# Patient Record
Sex: Female | Born: 1950 | Race: White | Hispanic: No | Marital: Married | Smoking: Never smoker
Health system: Southern US, Community
[De-identification: ages and names within clinical notes are randomized; demographics above are authoritative.]

## PROBLEM LIST (undated history)

## (undated) DIAGNOSIS — T7840XA Allergy, unspecified, initial encounter: Secondary | ICD-10-CM

## (undated) DIAGNOSIS — E079 Disorder of thyroid, unspecified: Secondary | ICD-10-CM

## (undated) DIAGNOSIS — R0683 Snoring: Secondary | ICD-10-CM

## (undated) DIAGNOSIS — F419 Anxiety disorder, unspecified: Secondary | ICD-10-CM

## (undated) DIAGNOSIS — H269 Unspecified cataract: Secondary | ICD-10-CM

## (undated) DIAGNOSIS — E785 Hyperlipidemia, unspecified: Secondary | ICD-10-CM

## (undated) DIAGNOSIS — I10 Essential (primary) hypertension: Secondary | ICD-10-CM

## (undated) HISTORY — PX: COLONOSCOPY: SHX174

## (undated) HISTORY — DX: Essential (primary) hypertension: I10

## (undated) HISTORY — PX: ABDOMINAL HYSTERECTOMY: SHX81

## (undated) HISTORY — PX: TUMOR EXCISION: SHX421

## (undated) HISTORY — PX: DILATION AND CURETTAGE OF UTERUS: SHX78

## (undated) HISTORY — DX: Disorder of thyroid, unspecified: E07.9

## (undated) HISTORY — DX: Snoring: R06.83

## (undated) HISTORY — DX: Anxiety disorder, unspecified: F41.9

## (undated) HISTORY — DX: Allergy, unspecified, initial encounter: T78.40XA

## (undated) HISTORY — PX: CATARACT EXTRACTION: SUR2

## (undated) HISTORY — PX: MOHS SURGERY: SUR867

## (undated) HISTORY — PX: EYE SURGERY: SHX253

## (undated) HISTORY — DX: Hyperlipidemia, unspecified: E78.5

## (undated) HISTORY — PX: DENTAL RESTORATION/EXTRACTION WITH X-RAY: SHX5796

## (undated) HISTORY — PX: OTHER SURGICAL HISTORY: SHX169

## (undated) HISTORY — DX: Unspecified cataract: H26.9

---

## 2012-01-30 LAB — LIPID PANEL
HDL: 68 mg/dL (ref 35–70)
LDL Cholesterol: 1289 mg/dL
Triglycerides: 192 mg/dL — AB (ref 40–160)

## 2012-01-30 LAB — BASIC METABOLIC PANEL
Glucose: 101 mg/dL
Sodium: 140 mmol/L (ref 137–147)

## 2012-01-30 LAB — HEPATIC FUNCTION PANEL
AST: 15 U/L (ref 13–35)
Alkaline Phosphatase: 69 U/L (ref 25–125)

## 2012-05-23 ENCOUNTER — Encounter: Payer: Self-pay | Admitting: *Deleted

## 2012-06-01 ENCOUNTER — Ambulatory Visit (INDEPENDENT_AMBULATORY_CARE_PROVIDER_SITE_OTHER): Payer: BC Managed Care – PPO | Admitting: Nurse Practitioner

## 2012-06-01 ENCOUNTER — Encounter: Payer: Self-pay | Admitting: Nurse Practitioner

## 2012-06-01 VITALS — BP 136/82 | HR 88 | Temp 98.2°F | Ht 64.0 in | Wt 214.0 lb

## 2012-06-01 DIAGNOSIS — E039 Hypothyroidism, unspecified: Secondary | ICD-10-CM

## 2012-06-01 DIAGNOSIS — Z363 Encounter for antenatal screening for malformations: Secondary | ICD-10-CM

## 2012-06-01 DIAGNOSIS — F411 Generalized anxiety disorder: Secondary | ICD-10-CM

## 2012-06-01 DIAGNOSIS — E785 Hyperlipidemia, unspecified: Secondary | ICD-10-CM

## 2012-06-01 DIAGNOSIS — Z1389 Encounter for screening for other disorder: Secondary | ICD-10-CM

## 2012-06-01 DIAGNOSIS — I10 Essential (primary) hypertension: Secondary | ICD-10-CM

## 2012-06-01 LAB — COMPLETE METABOLIC PANEL WITH GFR
ALT: 18 U/L (ref 0–35)
AST: 20 U/L (ref 0–37)
Albumin: 4.2 g/dL (ref 3.5–5.2)
CO2: 28 mEq/L (ref 19–32)
Calcium: 10 mg/dL (ref 8.4–10.5)
Chloride: 102 mEq/L (ref 96–112)
Creat: 0.86 mg/dL (ref 0.50–1.10)
GFR, Est African American: 85 mL/min
Potassium: 4.7 mEq/L (ref 3.5–5.3)

## 2012-06-01 NOTE — Patient Instructions (Signed)
Hypertriglyceridemia  Diet for High blood levels of Triglycerides Most fats in food are triglycerides. Triglycerides in your blood are stored as fat in your body. High levels of triglycerides in your blood may put you at a greater risk for heart disease and stroke.  Normal triglyceride levels are less than 150 mg/dL. Borderline high levels are 150-199 mg/dl. High levels are 200 - 499 mg/dL, and very high triglyceride levels are greater than 500 mg/dL. The decision to treat high triglycerides is generally based on the level. For people with borderline or high triglyceride levels, treatment includes weight loss and exercise. Drugs are recommended for people with very high triglyceride levels. Many people who need treatment for high triglyceride levels have metabolic syndrome. This syndrome is a collection of disorders that often include: insulin resistance, high blood pressure, blood clotting problems, high cholesterol and triglycerides. TESTING PROCEDURE FOR TRIGLYCERIDES  You should not eat 4 hours before getting your triglycerides measured. The normal range of triglycerides is between 10 and 250 milligrams per deciliter (mg/dl). Some people may have extreme levels (1000 or above), but your triglyceride level may be too high if it is above 150 mg/dl, depending on what other risk factors you have for heart disease.  People with high blood triglycerides may also have high blood cholesterol levels. If you have high blood cholesterol as well as high blood triglycerides, your risk for heart disease is probably greater than if you only had high triglycerides. High blood cholesterol is one of the main risk factors for heart disease. CHANGING YOUR DIET  Your weight can affect your blood triglyceride level. If you are more than 20% above your ideal body weight, you may be able to lower your blood triglycerides by losing weight. Eating less and exercising regularly is the best way to combat this. Fat provides more  calories than any other food. The best way to lose weight is to eat less fat. Only 30% of your total calories should come from fat. Less than 7% of your diet should come from saturated fat. A diet low in fat and saturated fat is the same as a diet to decrease blood cholesterol. By eating a diet lower in fat, you may lose weight, lower your blood cholesterol, and lower your blood triglyceride level.  Eating a diet low in fat, especially saturated fat, may also help you lower your blood triglyceride level. Ask your dietitian to help you figure how much fat you can eat based on the number of calories your caregiver has prescribed for you.  Exercise, in addition to helping with weight loss may also help lower triglyceride levels.   Alcohol can increase blood triglycerides. You may need to stop drinking alcoholic beverages.  Too much carbohydrate in your diet may also increase your blood triglycerides. Some complex carbohydrates are necessary in your diet. These may include bread, rice, potatoes, other starchy vegetables and cereals.  Reduce "simple" carbohydrates. These may include pure sugars, candy, honey, and jelly without losing other nutrients. If you have the kind of high blood triglycerides that is affected by the amount of carbohydrates in your diet, you will need to eat less sugar and less high-sugar foods. Your caregiver can help you with this.  Adding 2-4 grams of fish oil (EPA+ DHA) may also help lower triglycerides. Speak with your caregiver before adding any supplements to your regimen. Following the Diet  Maintain your ideal weight. Your caregivers can help you with a diet. Generally, eating less food and getting more   exercise will help you lose weight. Joining a weight control group may also help. Ask your caregivers for a good weight control group in your area.  Eat low-fat foods instead of high-fat foods. This can help you lose weight too.  These foods are lower in fat. Eat MORE of these:    Dried beans, peas, and lentils.  Egg whites.  Low-fat cottage cheese.  Fish.  Lean cuts of meat, such as round, sirloin, rump, and flank (cut extra fat off meat you fix).  Whole grain breads, cereals and pasta.  Skim and nonfat dry milk.  Low-fat yogurt.  Poultry without the skin.  Cheese made with skim or part-skim milk, such as mozzarella, parmesan, farmers', ricotta, or pot cheese. These are higher fat foods. Eat LESS of these:   Whole milk and foods made from whole milk, such as American, blue, cheddar, monterey jack, and swiss cheese  High-fat meats, such as luncheon meats, sausages, knockwurst, bratwurst, hot dogs, ribs, corned beef, ground pork, and regular ground beef.  Fried foods. Limit saturated fats in your diet. Substituting unsaturated fat for saturated fat may decrease your blood triglyceride level. You will need to read package labels to know which products contain saturated fats.  These foods are high in saturated fat. Eat LESS of these:   Fried pork skins.  Whole milk.  Skin and fat from poultry.  Palm oil.  Butter.  Shortening.  Cream cheese.  Bacon.  Margarines and baked goods made from listed oils.  Vegetable shortenings.  Chitterlings.  Fat from meats.  Coconut oil.  Palm kernel oil.  Lard.  Cream.  Sour cream.  Fatback.  Coffee whiteners and non-dairy creamers made with these oils.  Cheese made from whole milk. Use unsaturated fats (both polyunsaturated and monounsaturated) moderately. Remember, even though unsaturated fats are better than saturated fats; you still want a diet low in total fat.  These foods are high in unsaturated fat:   Canola oil.  Sunflower oil.  Mayonnaise.  Almonds.  Peanuts.  Pine nuts.  Margarines made with these oils.  Safflower oil.  Olive oil.  Avocados.  Cashews.  Peanut butter.  Sunflower seeds.  Soybean oil.  Peanut  oil.  Olives.  Pecans.  Walnuts.  Pumpkin seeds. Avoid sugar and other high-sugar foods. This will decrease carbohydrates without decreasing other nutrients. Sugar in your food goes rapidly to your blood. When there is excess sugar in your blood, your liver may use it to make more triglycerides. Sugar also contains calories without other important nutrients.  Eat LESS of these:   Sugar, brown sugar, powdered sugar, jam, jelly, preserves, honey, syrup, molasses, pies, candy, cakes, cookies, frosting, pastries, colas, soft drinks, punches, fruit drinks, and regular gelatin.  Avoid alcohol. Alcohol, even more than sugar, may increase blood triglycerides. In addition, alcohol is high in calories and low in nutrients. Ask for sparkling water, or a diet soft drink instead of an alcoholic beverage. Suggestions for planning and preparing meals   Bake, broil, grill or roast meats instead of frying.  Remove fat from meats and skin from poultry before cooking.  Add spices, herbs, lemon juice or vinegar to vegetables instead of salt, rich sauces or gravies.  Use a non-stick skillet without fat or use no-stick sprays.  Cool and refrigerate stews and broth. Then remove the hardened fat floating on the surface before serving.  Refrigerate meat drippings and skim off fat to make low-fat gravies.  Serve more fish.  Use less butter,   margarine and other high-fat spreads on bread or vegetables.  Use skim or reconstituted non-fat dry milk for cooking.  Cook with low-fat cheeses.  Substitute low-fat yogurt or cottage cheese for all or part of the sour cream in recipes for sauces, dips or congealed salads.  Use half yogurt/half mayonnaise in salad recipes.  Substitute evaporated skim milk for cream. Evaporated skim milk or reconstituted non-fat dry milk can be whipped and substituted for whipped cream in certain recipes.  Choose fresh fruits for dessert instead of high-fat foods such as pies or  cakes. Fruits are naturally low in fat. When Dining Out   Order low-fat appetizers such as fruit or vegetable juice, pasta with vegetables or tomato sauce.  Select clear, rather than cream soups.  Ask that dressings and gravies be served on the side. Then use less of them.  Order foods that are baked, broiled, poached, steamed, stir-fried, or roasted.  Ask for margarine instead of butter, and use only a small amount.  Drink sparkling water, unsweetened tea or coffee, or diet soft drinks instead of alcohol or other sweet beverages. QUESTIONS AND ANSWERS ABOUT OTHER FATS IN THE BLOOD: SATURATED FAT, TRANS FAT, AND CHOLESTEROL What is trans fat? Trans fat is a type of fat that is formed when vegetable oil is hardened through a process called hydrogenation. This process helps makes foods more solid, gives them shape, and prolongs their shelf life. Trans fats are also called hydrogenated or partially hydrogenated oils.  What do saturated fat, trans fat, and cholesterol in foods have to do with heart disease? Saturated fat, trans fat, and cholesterol in the diet all raise the level of LDL "bad" cholesterol in the blood. The higher the LDL cholesterol, the greater the risk for coronary heart disease (CHD). Saturated fat and trans fat raise LDL similarly.  What foods contain saturated fat, trans fat, and cholesterol? High amounts of saturated fat are found in animal products, such as fatty cuts of meat, chicken skin, and full-fat dairy products like butter, whole milk, cream, and cheese, and in tropical vegetable oils such as palm, palm kernel, and coconut oil. Trans fat is found in some of the same foods as saturated fat, such as vegetable shortening, some margarines (especially hard or stick margarine), crackers, cookies, baked goods, fried foods, salad dressings, and other processed foods made with partially hydrogenated vegetable oils. Small amounts of trans fat also occur naturally in some animal  products, such as milk products, beef, and lamb. Foods high in cholesterol include liver, other organ meats, egg yolks, shrimp, and full-fat dairy products. How can I use the new food label to make heart-healthy food choices? Check the Nutrition Facts panel of the food label. Choose foods lower in saturated fat, trans fat, and cholesterol. For saturated fat and cholesterol, you can also use the Percent Daily Value (%DV): 5% DV or less is low, and 20% DV or more is high. (There is no %DV for trans fat.) Use the Nutrition Facts panel to choose foods low in saturated fat and cholesterol, and if the trans fat is not listed, read the ingredients and limit products that list shortening or hydrogenated or partially hydrogenated vegetable oil, which tend to be high in trans fat. POINTS TO REMEMBER:   Discuss your risk for heart disease with your caregivers, and take steps to reduce risk factors.  Change your diet. Choose foods that are low in saturated fat, trans fat, and cholesterol.  Add exercise to your daily routine if   it is not already being done. Participate in physical activity of moderate intensity, like brisk walking, for at least 30 minutes on most, and preferably all days of the week. No time? Break the 30 minutes into three, 10-minute segments during the day.  Stop smoking. If you do smoke, contact your caregiver to discuss ways in which they can help you quit.  Do not use street drugs.  Maintain a normal weight.  Maintain a healthy blood pressure.  Keep up with your blood work for checking the fats in your blood as directed by your caregiver. Document Released: 12/14/2003 Document Revised: 08/27/2011 Document Reviewed: 07/11/2008 [employer] Patient Information 2013 [employer].  

## 2012-06-01 NOTE — Progress Notes (Signed)
  Subjective:    Patient ID: Linda Hernandez, female    DOB: 07/10/1950, 62 y.o.   MRN: [id number]  Hypertension This is a chronic problem. The current episode started more than 1 year ago. The problem is unchanged. The problem is controlled. Pertinent negatives include no blurred vision, chest pain, headaches, peripheral edema or shortness of breath. There are no associated agents to hypertension. Risk factors for coronary artery disease include dyslipidemia and family history. Treatments tried: ARB. The current treatment provides significant improvement. Compliance problems include diet and exercise.  Identifiable causes of hypertension include hyperparathyroidism.  Hyperlipidemia This is a chronic problem. The current episode started more than 1 year ago. The problem is controlled. Recent lipid tests were reviewed and are normal. Exacerbating diseases include hypothyroidism and obesity (hypothyroidism). There are no known factors aggravating her hyperlipidemia. Pertinent negatives include no chest pain or shortness of breath. Current antihyperlipidemic treatment includes statins. The current treatment provides significant improvement of lipids. Compliance problems include adherence to diet and adherence to exercise.  Risk factors for coronary artery disease include hypertension.  HYpothyroidism Doing well on current therapy. No fatigue or weakness GAD Cymbalta 30mg  1 po qd. Doing quite well. No med SE    Review of Systems  Eyes: Negative for blurred vision.  Respiratory: Negative for shortness of breath.   Cardiovascular: Negative for chest pain.  Neurological: Negative for headaches.  All other systems reviewed and are negative.       Objective:   Physical Exam  Vitals reviewed. Constitutional: She is oriented to person, place, and time. She appears well-developed and well-nourished.  HENT:  Head: Normocephalic.  Right Ear: External ear normal.  Left Ear: External ear normal.  Nose:  Nose normal.  Mouth/Throat: Oropharynx is clear and moist.  Eyes: Pupils are equal, round, and reactive to light.  Neck: Normal range of motion. Neck supple.  Cardiovascular: Normal rate, regular rhythm, normal heart sounds and intact distal pulses.   Pulmonary/Chest: Effort normal and breath sounds normal.  Abdominal: Soft. Bowel sounds are normal.  Musculoskeletal: Normal range of motion. She exhibits no edema.  Neurological: She is alert and oriented to person, place, and time. She has normal reflexes.  Skin: Skin is warm and dry.  Psychiatric: She has a normal mood and affect. Her behavior is normal. Judgment and thought content normal.          Assessment & Plan:  1. Essential hypertension, benig - COMPLETE METABOLIC PANEL WITH GFR  2. Other and unspecified hyperlipidemia - NMR Lipoprofile with Lipids  3. Unspecified hypothyroidism  4. GAD (generalized anxiety disorder)  Diet and exercise Labs pending Mammogram to be scheduled  Mary-Margaret Daphine Deutscher, FNP

## 2012-06-04 LAB — NMR LIPOPROFILE WITH LIPIDS
Cholesterol, Total: 196 mg/dL (ref <200)
HDL Particle Number: 44.4 umol/L (ref >=30.5)
LDL (calc): 88 mg/dL (ref <100)
LDL Size: 21 nm (ref >20.5)
LP-IR Score: 59 — ABNORMAL HIGH (ref <=45)
Large HDL-P: 7.2 umol/L (ref >=4.8)
Large VLDL-P: 8.3 nmol/L — ABNORMAL HIGH (ref <=2.7)
Small LDL Particle Number: 494 nmol/L (ref <=527)

## 2012-09-01 ENCOUNTER — Ambulatory Visit (INDEPENDENT_AMBULATORY_CARE_PROVIDER_SITE_OTHER): Payer: BC Managed Care – PPO | Admitting: Nurse Practitioner

## 2012-09-01 ENCOUNTER — Encounter: Payer: Self-pay | Admitting: Nurse Practitioner

## 2012-09-01 ENCOUNTER — Other Ambulatory Visit: Payer: BC Managed Care – PPO

## 2012-09-01 VITALS — BP 109/66 | HR 66 | Temp 97.4°F | Ht 63.75 in | Wt 211.0 lb

## 2012-09-01 DIAGNOSIS — E890 Postprocedural hypothyroidism: Secondary | ICD-10-CM | POA: Insufficient documentation

## 2012-09-01 DIAGNOSIS — E039 Hypothyroidism, unspecified: Secondary | ICD-10-CM

## 2012-09-01 DIAGNOSIS — I10 Essential (primary) hypertension: Secondary | ICD-10-CM | POA: Insufficient documentation

## 2012-09-01 DIAGNOSIS — F329 Major depressive disorder, single episode, unspecified: Secondary | ICD-10-CM

## 2012-09-01 DIAGNOSIS — E785 Hyperlipidemia, unspecified: Secondary | ICD-10-CM

## 2012-09-01 LAB — COMPLETE METABOLIC PANEL WITH GFR
ALT: 15 U/L (ref 0–35)
BUN: 16 mg/dL (ref 6–23)
CO2: 28 mEq/L (ref 19–32)
Calcium: 9.5 mg/dL (ref 8.4–10.5)
Chloride: 102 mEq/L (ref 96–112)
Creat: 0.98 mg/dL (ref 0.50–1.10)
GFR, Est African American: 73 mL/min
GFR, Est Non African American: 63 mL/min
Glucose, Bld: 104 mg/dL — ABNORMAL HIGH (ref 70–99)
Total Bilirubin: 0.6 mg/dL (ref 0.3–1.2)

## 2012-09-01 LAB — THYROID PANEL WITH TSH
T4, Total: 12.2 ug/dL (ref 5.0–12.5)
TSH: 0.948 u[IU]/mL (ref 0.350–4.500)

## 2012-09-01 MED ORDER — DULOXETINE HCL 30 MG PO CPEP: 30.0000 mg | ORAL_CAPSULE | Freq: Every day | ORAL | Status: DC

## 2012-09-01 MED ORDER — VALSARTAN 80 MG PO TABS: 80.0000 mg | ORAL_TABLET | Freq: Every day | ORAL | Status: DC

## 2012-09-01 MED ORDER — SIMVASTATIN 20 MG PO TABS: 20.0000 mg | ORAL_TABLET | Freq: Every day | ORAL | Status: DC

## 2012-09-01 MED ORDER — LEVOTHYROXINE SODIUM 50 MCG PO TABS: 50.0000 ug | ORAL_TABLET | Freq: Every day | ORAL | Status: DC

## 2012-09-01 NOTE — Progress Notes (Signed)
Subjective:    Patient ID: Linda Hernandez, female    DOB: Oct 12, 1950, 62 y.o.   MRN: [id number]  Hypertension This is a chronic problem. The current episode started more than 1 year ago. The problem has been resolved since onset. The problem is controlled. Pertinent negatives include no blurred vision, chest pain, headaches, neck pain, palpitations, peripheral edema, shortness of breath or sweats. There are no associated agents to hypertension. Risk factors for coronary artery disease include dyslipidemia, obesity and post-menopausal state. Past treatments include diuretics. The current treatment provides significant improvement. Compliance problems include diet and exercise.  Hypertensive end-organ damage includes a thyroid problem.  Hyperlipidemia This is a chronic problem. The current episode started more than 1 year ago. The problem is controlled. Recent lipid tests were reviewed and are normal. Exacerbating diseases include hypothyroidism. Factors aggravating her hyperlipidemia include thiazides. Pertinent negatives include no chest pain, focal weakness, leg pain or shortness of breath. Current antihyperlipidemic treatment includes statins. The current treatment provides significant improvement of lipids. Compliance problems include adherence to diet and adherence to exercise.  Risk factors for coronary artery disease include hypertension, obesity and post-menopausal.  Thyroid Problem Presents for follow-up (hypothyroid) visit. Patient reports no anxiety, depressed mood, diaphoresis, diarrhea, dry skin, heat intolerance, hoarse voice, menstrual problem, nail problem, palpitations, visual change or weight loss. The symptoms have been stable. Her past medical history is significant for hyperlipidemia.      Review of Systems  Constitutional: Negative for weight loss and diaphoresis.  HENT: Negative for hoarse voice and neck pain.   Eyes: Negative for blurred vision.  Respiratory: Negative for  shortness of breath.   Cardiovascular: Negative for chest pain and palpitations.  Gastrointestinal: Negative for diarrhea.  Endocrine: Negative for heat intolerance.  Genitourinary: Negative for menstrual problem.  Neurological: Negative for focal weakness and headaches.  All other systems reviewed and are negative.       Objective:   Physical Exam  Constitutional: She is oriented to person, place, and time. She appears well-developed and well-nourished.  HENT:  Nose: Nose normal.  Mouth/Throat: Oropharynx is clear and moist.  Eyes: EOM are normal.  Neck: Trachea normal, normal range of motion and full passive range of motion without pain. Neck supple. No JVD present. Carotid bruit is not present. No thyromegaly present.  Cardiovascular: Normal rate, regular rhythm, normal heart sounds and intact distal pulses.  Exam reveals no gallop and no friction rub.   No murmur heard. Pulmonary/Chest: Effort normal and breath sounds normal.  Abdominal: Soft. Bowel sounds are normal. She exhibits no distension and no mass. There is no tenderness.  Musculoskeletal: Normal range of motion.  Lymphadenopathy:    She has no cervical adenopathy.  Neurological: She is alert and oriented to person, place, and time. She has normal reflexes.  Skin: Skin is warm and dry.  Psychiatric: She has a normal mood and affect. Her behavior is normal. Judgment and thought content normal.    BP 109/66  Pulse 66  Temp(Src) 97.4 F (36.3 C) (Oral)  Ht 5' 3.75" (1.619 m)  Wt 211 lb (95.709 kg)  BMI 36.51 kg/m2  LMP 06/01/1997       Assessment & Plan:  1. Hyperlipidemia Low fat diet and exercise - simvastatin (ZOCOR) 20 MG tablet; Take 1 tablet (20 mg total) by mouth daily.  Dispense: 90 tablet; Refill: 1 - NMR Lipoprofile with Lipids  2. Hypertension Low NA+ diet - valsartan (DIOVAN) 80 MG tablet; Take 1 tablet (80 mg  total) by mouth daily.  Dispense: 90 tablet; Refill: 1 - COMPLETE METABOLIC PANEL  WITH GFR  3. Hypothyroidism  - levothyroxine (SYNTHROID, LEVOTHROID) 50 MCG tablet; Take 1 tablet (50 mcg total) by mouth daily.  Dispense: 90 tablet; Refill: 1 - Thyroid Panel With TSH  4. Depression Stress management - DULoxetine (CYMBALTA) 30 MG capsule; Take 1 capsule (30 mg total) by mouth daily.  Dispense: 90 capsule; Refill: 1  Mary-Margaret Daphine Deutscher, FNP

## 2012-09-01 NOTE — Patient Instructions (Signed)

## 2012-09-03 ENCOUNTER — Other Ambulatory Visit: Payer: Self-pay | Admitting: Nurse Practitioner

## 2012-09-03 LAB — NMR LIPOPROFILE WITH LIPIDS
HDL Particle Number: 38.8 umol/L (ref >=30.5)
HDL Size: 8.8 nm — ABNORMAL LOW (ref >=9.2)
HDL-C: 56 mg/dL (ref >=40)
LDL (calc): 95 mg/dL (ref <100)
LDL Size: 20.9 nm (ref >20.5)
LP-IR Score: 54 — ABNORMAL HIGH (ref <=45)
Small LDL Particle Number: 737 nmol/L — ABNORMAL HIGH (ref <=527)

## 2012-09-03 MED ORDER — SIMVASTATIN 40 MG PO TABS: 40.0000 mg | ORAL_TABLET | Freq: Every day | ORAL | Status: DC

## 2012-09-08 ENCOUNTER — Other Ambulatory Visit: Payer: Self-pay | Admitting: Nurse Practitioner

## 2012-12-09 ENCOUNTER — Ambulatory Visit (INDEPENDENT_AMBULATORY_CARE_PROVIDER_SITE_OTHER): Payer: BC Managed Care – PPO | Admitting: Nurse Practitioner

## 2012-12-09 ENCOUNTER — Encounter: Payer: Self-pay | Admitting: Nurse Practitioner

## 2012-12-09 VITALS — BP 119/74 | HR 66 | Temp 97.4°F | Ht 64.0 in | Wt 212.0 lb

## 2012-12-09 DIAGNOSIS — E039 Hypothyroidism, unspecified: Secondary | ICD-10-CM

## 2012-12-09 DIAGNOSIS — E785 Hyperlipidemia, unspecified: Secondary | ICD-10-CM

## 2012-12-09 DIAGNOSIS — I10 Essential (primary) hypertension: Secondary | ICD-10-CM

## 2012-12-09 DIAGNOSIS — F329 Major depressive disorder, single episode, unspecified: Secondary | ICD-10-CM

## 2012-12-09 MED ORDER — SIMVASTATIN 40 MG PO TABS: 40.0000 mg | ORAL_TABLET | Freq: Every day | ORAL | Status: DC

## 2012-12-09 MED ORDER — VALSARTAN 80 MG PO TABS: 80.0000 mg | ORAL_TABLET | Freq: Every day | ORAL | Status: DC

## 2012-12-09 MED ORDER — LEVOTHYROXINE SODIUM 50 MCG PO TABS: 50.0000 ug | ORAL_TABLET | Freq: Every day | ORAL | Status: DC

## 2012-12-09 MED ORDER — DULOXETINE HCL 30 MG PO CPEP: 30.0000 mg | ORAL_CAPSULE | Freq: Every day | ORAL | Status: DC

## 2012-12-09 NOTE — Patient Instructions (Signed)

## 2012-12-09 NOTE — Progress Notes (Signed)
Subjective:    Patient ID: Linda Hernandez, female    DOB: 09-12-1950, 62 y.o.   MRN: [id number]  Hypertension This is a chronic problem. The current episode started more than 1 year ago. The problem has been resolved since onset. The problem is controlled. Pertinent negatives include no blurred vision, chest pain, headaches, neck pain, palpitations, peripheral edema, shortness of breath or sweats. There are no associated agents to hypertension. Risk factors for coronary artery disease include dyslipidemia, obesity and post-menopausal state. Past treatments include diuretics. The current treatment provides significant improvement. Compliance problems include diet and exercise.  Hypertensive end-organ damage includes a thyroid problem.  Hyperlipidemia This is a chronic problem. The current episode started more than 1 year ago. The problem is controlled. Recent lipid tests were reviewed and are normal. Exacerbating diseases include hypothyroidism. Factors aggravating her hyperlipidemia include thiazides. Pertinent negatives include no chest pain, focal weakness, leg pain or shortness of breath. Current antihyperlipidemic treatment includes statins. The current treatment provides significant improvement of lipids. Compliance problems include adherence to diet and adherence to exercise.  Risk factors for coronary artery disease include hypertension, obesity and post-menopausal.  Thyroid Problem Presents for follow-up (hypothyroid) visit. Patient reports no anxiety, depressed mood, diaphoresis, diarrhea, dry skin, heat intolerance, hoarse voice, menstrual problem, nail problem, palpitations, visual change or weight loss. The symptoms have been stable. Her past medical history is significant for hyperlipidemia.      Review of Systems  Constitutional: Negative for weight loss and diaphoresis.  HENT: Negative for hoarse voice and neck pain.   Eyes: Negative for blurred vision.  Respiratory: Negative for  shortness of breath.   Cardiovascular: Negative for chest pain and palpitations.  Gastrointestinal: Negative for diarrhea.  Endocrine: Negative for heat intolerance.  Genitourinary: Negative for menstrual problem.  Neurological: Negative for focal weakness and headaches.  All other systems reviewed and are negative.       Objective:   Physical Exam  Constitutional: She is oriented to person, place, and time. She appears well-developed and well-nourished.  HENT:  Nose: Nose normal.  Mouth/Throat: Oropharynx is clear and moist.  Eyes: EOM are normal.  Neck: Trachea normal, normal range of motion and full passive range of motion without pain. Neck supple. No JVD present. Carotid bruit is not present. No thyromegaly present.  Cardiovascular: Normal rate, regular rhythm, normal heart sounds and intact distal pulses.  Exam reveals no gallop and no friction rub.   No murmur heard. Pulmonary/Chest: Effort normal and breath sounds normal.  Abdominal: Soft. Bowel sounds are normal. She exhibits no distension and no mass. There is no tenderness.  Musculoskeletal: Normal range of motion.  Lymphadenopathy:    She has no cervical adenopathy.  Neurological: She is alert and oriented to person, place, and time. She has normal reflexes.  Skin: Skin is warm and dry.  Psychiatric: She has a normal mood and affect. Her behavior is normal. Judgment and thought content normal.    BP 119/74  Pulse 66  Temp(Src) 97.4 F (36.3 C) (Oral)  Ht 5\' 4"  (1.626 m)  Wt 212 lb (96.163 kg)  BMI 36.37 kg/m2  LMP 06/01/1997       Assessment & Plan:  1. Hypertension Low NA+ diet - valsartan (DIOVAN) 80 MG tablet; Take 1 tablet (80 mg total) by mouth daily.  Dispense: 90 tablet; Refill: 1 - CMP14+EGFR  2. Hypothyroidism - levothyroxine (SYNTHROID, LEVOTHROID) 50 MCG tablet; Take 1 tablet (50 mcg total) by mouth daily.  Dispense: 90  tablet; Refill: 1  3. Depression Stress management - DULoxetine  (CYMBALTA) 30 MG capsule; Take 1 capsule (30 mg total) by mouth daily.  Dispense: 90 capsule; Refill: 1  4. Hyperlipidemia Low fat diet and exercise - simvastatin (ZOCOR) 40 MG tablet; Take 1 tablet (40 mg total) by mouth at bedtime.  Dispense: 30 tablet; Refill: 5 - NMR, lipoprofile  Mary-Margaret Daphine Deutscher, FNP

## 2012-12-11 ENCOUNTER — Telehealth: Payer: Self-pay | Admitting: Nurse Practitioner

## 2012-12-11 LAB — CMP14+EGFR
AST: 21 IU/L (ref 0–40)
Alkaline Phosphatase: 75 IU/L (ref 39–117)
BUN/Creatinine Ratio: 16 (ref 11–26)
CO2: 27 mmol/L (ref 18–29)
Chloride: 102 mmol/L (ref 97–108)
Globulin, Total: 2.4 g/dL (ref 1.5–4.5)
Sodium: 142 mmol/L (ref 134–144)
Total Bilirubin: 0.5 mg/dL (ref 0.0–1.2)

## 2012-12-11 LAB — NMR, LIPOPROFILE
HDL Particle Number: 47.4 umol/L (ref >=30.5)
LDL Particle Number: 1019 nmol/L — ABNORMAL HIGH (ref <1000)
LDL Size: 20.8 nm (ref >20.5)
LDLC SERPL CALC-MCNC: 66 mg/dL (ref <100)
LP-IR Score: 57 — ABNORMAL HIGH (ref <=45)

## 2012-12-11 NOTE — Telephone Encounter (Signed)
Error

## 2012-12-16 ENCOUNTER — Telehealth: Payer: Self-pay | Admitting: *Deleted

## 2012-12-16 MED ORDER — LOSARTAN POTASSIUM 100 MG PO TABS: 100.0000 mg | ORAL_TABLET | Freq: Every day | ORAL | Status: DC

## 2012-12-16 NOTE — Telephone Encounter (Signed)
Changed valsartan to losartan- keep record of blood pressure

## 2012-12-16 NOTE — Telephone Encounter (Signed)
Ins will not cover valsartan until Linda Hernandez has tried and failed at least two of the generic arb,ie.ibesartan, losaetan or to apreferred generic or preferred brand arb i.e., benicar or benicar hct, this does not really make sense because benicar and benicar hct both have to be prior authorized.Marland Kitchen  i guess they want you to try the ibesartan or losartan.  Thanks for taking care of this for me.

## 2012-12-17 NOTE — Telephone Encounter (Signed)
Please review

## 2013-01-04 ENCOUNTER — Other Ambulatory Visit: Payer: Self-pay

## 2013-01-04 DIAGNOSIS — E785 Hyperlipidemia, unspecified: Secondary | ICD-10-CM

## 2013-01-04 MED ORDER — SIMVASTATIN 40 MG PO TABS: 40.0000 mg | ORAL_TABLET | Freq: Every day | ORAL | Status: DC

## 2013-01-04 NOTE — Telephone Encounter (Signed)
Last seen and last lipids 10/14  MMM  If approved print and route to nurse for mail order

## 2013-03-22 ENCOUNTER — Ambulatory Visit (INDEPENDENT_AMBULATORY_CARE_PROVIDER_SITE_OTHER): Payer: BC Managed Care – PPO | Admitting: Nurse Practitioner

## 2013-03-22 ENCOUNTER — Encounter: Payer: Self-pay | Admitting: Nurse Practitioner

## 2013-03-22 VITALS — BP 126/78 | HR 73 | Temp 97.2°F | Ht 64.0 in | Wt 212.0 lb

## 2013-03-22 DIAGNOSIS — E785 Hyperlipidemia, unspecified: Secondary | ICD-10-CM

## 2013-03-22 DIAGNOSIS — I10 Essential (primary) hypertension: Secondary | ICD-10-CM

## 2013-03-22 DIAGNOSIS — E039 Hypothyroidism, unspecified: Secondary | ICD-10-CM

## 2013-03-22 NOTE — Patient Instructions (Signed)

## 2013-03-22 NOTE — Progress Notes (Signed)
Subjective:    Patient ID: Linda Hernandez, female    DOB: Feb 06, 1951, 63 y.o.   MRN: [id number]  Patient in today for chronic follow up- no changes since last visit.  Hypertension This is a chronic problem. The current episode started more than 1 year ago. The problem has been resolved since onset. The problem is controlled. Pertinent negatives include no blurred vision, chest pain, headaches, neck pain, palpitations, peripheral edema, shortness of breath or sweats. There are no associated agents to hypertension. Risk factors for coronary artery disease include dyslipidemia, obesity and post-menopausal state. Past treatments include diuretics. The current treatment provides significant improvement. Compliance problems include diet and exercise.  Hypertensive end-organ damage includes a thyroid problem.  Hyperlipidemia This is a chronic problem. The current episode started more than 1 year ago. The problem is controlled. Recent lipid tests were reviewed and are normal. Exacerbating diseases include hypothyroidism. Factors aggravating her hyperlipidemia include thiazides. Pertinent negatives include no chest pain, focal weakness, leg pain or shortness of breath. Current antihyperlipidemic treatment includes statins. The current treatment provides significant improvement of lipids. Compliance problems include adherence to diet and adherence to exercise.  Risk factors for coronary artery disease include hypertension, obesity and post-menopausal.  Thyroid Problem Presents for follow-up (hypothyroid) visit. Patient reports no anxiety, depressed mood, diaphoresis, diarrhea, dry skin, heat intolerance, hoarse voice, menstrual problem, nail problem, palpitations, visual change or weight loss. The symptoms have been stable. Her past medical history is significant for hyperlipidemia.      Review of Systems  Constitutional: Negative for weight loss and diaphoresis.  HENT: Negative for hoarse voice.   Eyes:  Negative for blurred vision.  Respiratory: Negative for shortness of breath.   Cardiovascular: Negative for chest pain and palpitations.  Gastrointestinal: Negative for diarrhea.  Endocrine: Negative for heat intolerance.  Genitourinary: Negative for menstrual problem.  Musculoskeletal: Negative for neck pain.  Neurological: Negative for focal weakness and headaches.  All other systems reviewed and are negative.       Objective:   Physical Exam  Constitutional: She is oriented to person, place, and time. She appears well-developed and well-nourished.  HENT:  Nose: Nose normal.  Mouth/Throat: Oropharynx is clear and moist.  Eyes: EOM are normal.  Neck: Trachea normal, normal range of motion and full passive range of motion without pain. Neck supple. No JVD present. Carotid bruit is not present. No thyromegaly present.  Cardiovascular: Normal rate, regular rhythm, normal heart sounds and intact distal pulses.  Exam reveals no gallop and no friction rub.   No murmur heard. Pulmonary/Chest: Effort normal and breath sounds normal.  Abdominal: Soft. Bowel sounds are normal. She exhibits no distension and no mass. There is no tenderness.  Musculoskeletal: Normal range of motion.  Lymphadenopathy:    She has no cervical adenopathy.  Neurological: She is alert and oriented to person, place, and time. She has normal reflexes.  Skin: Skin is warm and dry.  Psychiatric: She has a normal mood and affect. Her behavior is normal. Judgment and thought content normal.    BP 126/78  Pulse 73  Temp(Src) 97.2 F (36.2 C) (Oral)  Ht 5' 4" (1.626 m)  Wt 212 lb (96.163 kg)  BMI 36.37 kg/m2  LMP 06/01/1997       Assessment & Plan:   1. Hypothyroidism   2. Hypertension   3. Hyperlipidemia    Orders Placed This Encounter  Procedures  . CMP14+EGFR  . NMR, lipoprofile   Hemoccult cards given Continue all  meds Labs pending Diet and exercise encouraged Health maintenance  reviewed Follow up in 3 months  [place], FNP

## 2013-03-23 ENCOUNTER — Other Ambulatory Visit: Payer: BC Managed Care – PPO

## 2013-03-23 NOTE — Progress Notes (Signed)
Patient dropped off fobt 

## 2013-03-24 LAB — NMR, LIPOPROFILE
CHOLESTEROL: 162 mg/dL (ref <200)
HDL Cholesterol by NMR: 71 mg/dL (ref >=40)
HDL Particle Number: 45.7 umol/L (ref >=30.5)
LDL PARTICLE NUMBER: 1038 nmol/L — AB (ref <1000)
LDL Size: 20.5 nm — ABNORMAL LOW (ref >20.5)
LDLC SERPL CALC-MCNC: 63 mg/dL (ref <100)
LP-IR Score: 53 — ABNORMAL HIGH (ref <=45)
Small LDL Particle Number: 435 nmol/L (ref <=527)
TRIGLYCERIDES BY NMR: 140 mg/dL (ref <150)

## 2013-03-24 LAB — CMP14+EGFR
ALBUMIN: 4.2 g/dL (ref 3.6–4.8)
ALK PHOS: 84 IU/L (ref 39–117)
ALT: 22 IU/L (ref 0–32)
AST: 17 IU/L (ref 0–40)
Albumin/Globulin Ratio: 1.8 (ref 1.1–2.5)
BILIRUBIN TOTAL: 0.5 mg/dL (ref 0.0–1.2)
BUN / CREAT RATIO: 23 (ref 11–26)
BUN: 20 mg/dL (ref 8–27)
CHLORIDE: 100 mmol/L (ref 97–108)
CO2: 25 mmol/L (ref 18–29)
CREATININE: 0.88 mg/dL (ref 0.57–1.00)
Calcium: 9.8 mg/dL (ref 8.6–10.2)
GFR calc non Af Amer: 72 mL/min/{1.73_m2} (ref >59)
GFR, EST AFRICAN AMERICAN: 83 mL/min/{1.73_m2} (ref >59)
GLOBULIN, TOTAL: 2.4 g/dL (ref 1.5–4.5)
Glucose: 113 mg/dL — ABNORMAL HIGH (ref 65–99)
Potassium: 4.9 mmol/L (ref 3.5–5.2)
SODIUM: 139 mmol/L (ref 134–144)
Total Protein: 6.6 g/dL (ref 6.0–8.5)

## 2013-03-24 LAB — FECAL OCCULT BLOOD, IMMUNOCHEMICAL: Fecal Occult Bld: NEGATIVE

## 2013-03-25 ENCOUNTER — Encounter: Payer: Self-pay | Admitting: *Deleted

## 2013-03-25 NOTE — Progress Notes (Signed)
Quick Note:  Copy of labs sent to patient ______ 

## 2013-06-21 ENCOUNTER — Encounter: Payer: Self-pay | Admitting: Nurse Practitioner

## 2013-06-21 ENCOUNTER — Ambulatory Visit (INDEPENDENT_AMBULATORY_CARE_PROVIDER_SITE_OTHER): Payer: BC Managed Care – PPO | Admitting: Nurse Practitioner

## 2013-06-21 VITALS — BP 128/71 | HR 65 | Temp 97.0°F | Ht 64.0 in | Wt 215.6 lb

## 2013-06-21 DIAGNOSIS — F32A Depression, unspecified: Secondary | ICD-10-CM

## 2013-06-21 DIAGNOSIS — F411 Generalized anxiety disorder: Secondary | ICD-10-CM

## 2013-06-21 DIAGNOSIS — F3289 Other specified depressive episodes: Secondary | ICD-10-CM

## 2013-06-21 DIAGNOSIS — F329 Major depressive disorder, single episode, unspecified: Secondary | ICD-10-CM

## 2013-06-21 DIAGNOSIS — E039 Hypothyroidism, unspecified: Secondary | ICD-10-CM

## 2013-06-21 DIAGNOSIS — I10 Essential (primary) hypertension: Secondary | ICD-10-CM

## 2013-06-21 DIAGNOSIS — E785 Hyperlipidemia, unspecified: Secondary | ICD-10-CM

## 2013-06-21 DIAGNOSIS — Z713 Dietary counseling and surveillance: Secondary | ICD-10-CM

## 2013-06-21 MED ORDER — SIMVASTATIN 40 MG PO TABS: 40.0000 mg | ORAL_TABLET | Freq: Every day | ORAL | Status: DC

## 2013-06-21 MED ORDER — DULOXETINE HCL 30 MG PO CPEP: 30.0000 mg | ORAL_CAPSULE | Freq: Every day | ORAL | Status: DC

## 2013-06-21 MED ORDER — LEVOTHYROXINE SODIUM 50 MCG PO TABS: 50.0000 ug | ORAL_TABLET | Freq: Every day | ORAL | Status: DC

## 2013-06-21 MED ORDER — LOSARTAN POTASSIUM 100 MG PO TABS: 100.0000 mg | ORAL_TABLET | Freq: Every day | ORAL | Status: DC

## 2013-06-21 NOTE — Progress Notes (Signed)
Subjective:    Patient ID: Linda Hernandez, female    DOB: Sep 29, 1950, 63 y.o.   MRN: [id number]  Patient in today for chronic follow up- no changes since last visit.  Hypertension This is a chronic problem. The current episode started more than 1 year ago. The problem has been resolved since onset. The problem is controlled. Pertinent negatives include no blurred vision, chest pain, headaches, neck pain, palpitations, peripheral edema, shortness of breath or sweats. There are no associated agents to hypertension. Risk factors for coronary artery disease include dyslipidemia, obesity and post-menopausal state. Past treatments include diuretics. The current treatment provides significant improvement. Compliance problems include diet and exercise.  Hypertensive end-organ damage includes a thyroid problem.  Hyperlipidemia This is a chronic problem. The current episode started more than 1 year ago. The problem is controlled. Recent lipid tests were reviewed and are normal. Exacerbating diseases include hypothyroidism. Factors aggravating her hyperlipidemia include thiazides. Pertinent negatives include no chest pain, focal weakness, leg pain or shortness of breath. Current antihyperlipidemic treatment includes statins. The current treatment provides significant improvement of lipids. Compliance problems include adherence to diet and adherence to exercise.  Risk factors for coronary artery disease include hypertension, obesity and post-menopausal.  Thyroid Problem Presents for follow-up (hypothyroid) visit. Patient reports no anxiety, depressed mood, diaphoresis, diarrhea, dry skin, heat intolerance, hoarse voice, menstrual problem, nail problem, palpitations, visual change or weight loss. The symptoms have been stable. Her past medical history is significant for hyperlipidemia.  GAD Cymbalta working well- keeps her from worrying so much.   Review of Systems  Constitutional: Negative for weight loss and  diaphoresis.  HENT: Negative for hoarse voice.   Eyes: Negative for blurred vision.  Respiratory: Negative for shortness of breath.   Cardiovascular: Negative for chest pain and palpitations.  Gastrointestinal: Negative for diarrhea.  Endocrine: Negative for heat intolerance.  Genitourinary: Negative for menstrual problem.  Musculoskeletal: Negative for neck pain.  Neurological: Negative for focal weakness and headaches.  All other systems reviewed and are negative.      Objective:   Physical Exam  Constitutional: She is oriented to person, place, and time. She appears well-developed and well-nourished.  HENT:  Nose: Nose normal.  Mouth/Throat: Oropharynx is clear and moist.  Eyes: EOM are normal.  Neck: Trachea normal, normal range of motion and full passive range of motion without pain. Neck supple. No JVD present. Carotid bruit is not present. No thyromegaly present.  Cardiovascular: Normal rate, regular rhythm, normal heart sounds and intact distal pulses.  Exam reveals no gallop and no friction rub.   No murmur heard. Pulmonary/Chest: Effort normal and breath sounds normal.  Abdominal: Soft. Bowel sounds are normal. She exhibits no distension and no mass. There is no tenderness.  Musculoskeletal: Normal range of motion.  Lymphadenopathy:    She has no cervical adenopathy.  Neurological: She is alert and oriented to person, place, and time. She has normal reflexes.  Skin: Skin is warm and dry.  Psychiatric: She has a normal mood and affect. Her behavior is normal. Judgment and thought content normal.    BP 128/71  Pulse 65  Temp(Src) 97 F (36.1 C) (Oral)  Ht 5' 4"  (1.626 m)  Wt 215 lb 9.6 oz (97.796 kg)  BMI 36.99 kg/m2  LMP 06/01/1997       Assessment & Plan:   1. Hypothyroidism   2. Hypertension   3. Hyperlipidemia   4. GAD (generalized anxiety disorder)   5. Depression   6.  Weight loss counseling, encounter for    Orders Placed This Encounter   Procedures  . CMP14+EGFR  . NMR, lipoprofile  . Thyroid Panel With TSH   Meds ordered this encounter  Medications  . DULoxetine (CYMBALTA) 30 MG capsule    Sig: Take 1 capsule (30 mg total) by mouth daily.    Dispense:  90 capsule    Refill:  1    Order Specific Question:  Supervising Provider    Answer:  Chipper Herb [[id number]]  . simvastatin (ZOCOR) 40 MG tablet    Sig: Take 1 tablet (40 mg total) by mouth at bedtime.    Dispense:  90 tablet    Refill:  1    Order Specific Question:  Supervising Provider    Answer:  Chipper Herb [[id number]]  . levothyroxine (SYNTHROID, LEVOTHROID) 50 MCG tablet    Sig: Take 1 tablet (50 mcg total) by mouth daily.    Dispense:  90 tablet    Refill:  1    Order Specific Question:  Supervising Provider    Answer:  Chipper Herb [[id number]]  . losartan (COZAAR) 100 MG tablet    Sig: Take 1 tablet (100 mg total) by mouth daily.    Dispense:  90 tablet    Refill:  1    Order Specific Question:  Supervising Provider    Answer:  Chipper Herb [[id number]]   Patient to schedule mammogram on way out today Labs pending Health maintenance reviewed Diet and exercise encouraged Continue all meds Follow up  In 3 months   [place], FNP

## 2013-06-21 NOTE — Patient Instructions (Signed)
Health Maintenance, Female A healthy lifestyle and preventative care can promote health and wellness.  Maintain regular health, dental, and eye exams.  Eat a healthy diet. Foods like vegetables, fruits, whole grains, low-fat dairy products, and lean protein foods contain the nutrients you need without too many calories. Decrease your intake of foods high in solid fats, added sugars, and salt. Get information about a proper diet from your caregiver, if necessary.  Regular physical exercise is one of the most important things you can do for your health. Most adults should get at least 150 minutes of moderate-intensity exercise (any activity that increases your heart rate and causes you to sweat) each week. In addition, most adults need muscle-strengthening exercises on 2 or more days a week.   Maintain a healthy weight. The body mass index (BMI) is a screening tool to identify possible weight problems. It provides an estimate of body fat based on height and weight. Your caregiver can help determine your BMI, and can help you achieve or maintain a healthy weight. For adults 20 years and older:  A BMI below 18.5 is considered underweight.  A BMI of 18.5 to 24.9 is normal.  A BMI of 25 to 29.9 is considered overweight.  A BMI of 30 and above is considered obese.  Maintain normal blood lipids and cholesterol by exercising and minimizing your intake of saturated fat. Eat a balanced diet with plenty of fruits and vegetables. Blood tests for lipids and cholesterol should begin at age 20 and be repeated every 5 years. If your lipid or cholesterol levels are high, you are over 50, or you are a high risk for heart disease, you may need your cholesterol levels checked more frequently.Ongoing high lipid and cholesterol levels should be treated with medicines if diet and exercise are not effective.  If you smoke, find out from your caregiver how to quit. If you do not use tobacco, do not start.  Lung  cancer screening is recommended for adults aged 55 80 years who are at high risk for developing lung cancer because of a history of smoking. Yearly low-dose computed tomography (CT) is recommended for people who have at least a 30-pack-year history of smoking and are a current smoker or have quit within the past 15 years. A pack year of smoking is smoking an average of 1 pack of cigarettes a day for 1 year (for example: 1 pack a day for 30 years or 2 packs a day for 15 years). Yearly screening should continue until the smoker has stopped smoking for at least 15 years. Yearly screening should also be stopped for people who develop a health problem that would prevent them from having lung cancer treatment.  If you are pregnant, do not drink alcohol. If you are breastfeeding, be very cautious about drinking alcohol. If you are not pregnant and choose to drink alcohol, do not exceed 1 drink per day. One drink is considered to be 12 ounces (355 mL) of beer, 5 ounces (148 mL) of wine, or 1.5 ounces (44 mL) of liquor.  Avoid use of street drugs. Do not share needles with anyone. Ask for help if you need support or instructions about stopping the use of drugs.  High blood pressure causes heart disease and increases the risk of stroke. Blood pressure should be checked at least every 1 to 2 years. Ongoing high blood pressure should be treated with medicines, if weight loss and exercise are not effective.  If you are 55 to   61 years old, ask your caregiver if you should take aspirin to prevent strokes.  Diabetes screening involves taking a blood sample to check your fasting blood sugar level. This should be done once every 3 years, after age 45, if you are within normal weight and without risk factors for diabetes. Testing should be considered at a younger age or be carried out more frequently if you are overweight and have at least 1 risk factor for diabetes.  Breast cancer screening is essential preventative care  for women. You should practice "breast self-awareness." This means understanding the normal appearance and feel of your breasts and may include breast self-examination. Any changes detected, no matter how small, should be reported to a caregiver. Women in their 71s and 30s should have a clinical breast exam (CBE) by a caregiver as part of a regular health exam every 1 to 3 years. After age 16, women should have a CBE every year. Starting at age 43, women should consider having a mammogram (breast X-ray) every year. Women who have a family history of breast cancer should talk to their caregiver about genetic screening. Women at a high risk of breast cancer should talk to their caregiver about having an MRI and a mammogram every year.  Breast cancer gene (BRCA)-related cancer risk assessment is recommended for women who have family members with BRCA-related cancers. BRCA-related cancers include breast, ovarian, tubal, and peritoneal cancers. Having family members with these cancers may be associated with an increased risk for harmful changes (mutations) in the breast cancer genes BRCA1 and BRCA2. Results of the assessment will determine the need for genetic counseling and BRCA1 and BRCA2 testing.  The Pap test is a screening test for cervical cancer. Women should have a Pap test starting at age 18. Between ages 73 and 69, Pap tests should be repeated every 2 years. Beginning at age 48, you should have a Pap test every 3 years as long as the past 3 Pap tests have been normal. If you had a hysterectomy for a problem that was not cancer or a condition that could lead to cancer, then you no longer need Pap tests. If you are between ages 58 and 65, and you have had normal Pap tests going back 10 years, you no longer need Pap tests. If you have had past treatment for cervical cancer or a condition that could lead to cancer, you need Pap tests and screening for cancer for at least 20 years after your treatment. If Pap  tests have been discontinued, risk factors (such as a new sexual partner) need to be reassessed to determine if screening should be resumed. Some women have medical problems that increase the chance of getting cervical cancer. In these cases, your caregiver may recommend more frequent screening and Pap tests.  The human papillomavirus (HPV) test is an additional test that may be used for cervical cancer screening. The HPV test looks for the virus that can cause the cell changes on the cervix. The cells collected during the Pap test can be tested for HPV. The HPV test could be used to screen women aged 66 years and older, and should be used in women of any age who have unclear Pap test results. After the age of 39, women should have HPV testing at the same frequency as a Pap test.  Colorectal cancer can be detected and often prevented. Most routine colorectal cancer screening begins at the age of 79 and continues through age 75. However, your caregiver  may recommend screening at an earlier age if you have risk factors for colon cancer. On a yearly basis, your caregiver may provide home test kits to check for hidden blood in the stool. Use of a small camera at the end of a tube, to directly examine the colon (sigmoidoscopy or colonoscopy), can detect the earliest forms of colorectal cancer. Talk to your caregiver about this at age 73, when routine screening begins. Direct examination of the colon should be repeated every 5 to 10 years through age 61, unless early forms of pre-cancerous polyps or small growths are found.  Hepatitis C blood testing is recommended for all people born from [birth year] through 1963 and any individual with known risks for hepatitis C.  Practice safe sex. Use condoms and avoid high-risk sexual practices to reduce the spread of sexually transmitted infections (STIs). Sexually active women aged 38 and younger should be checked for Chlamydia, which is a common sexually transmitted infection.  Older women with new or multiple partners should also be tested for Chlamydia. Testing for other STIs is recommended if you are sexually active and at increased risk.  Osteoporosis is a disease in which the bones lose minerals and strength with aging. This can result in serious bone fractures. The risk of osteoporosis can be identified using a bone density scan. Women ages 44 and over and women at risk for fractures or osteoporosis should discuss screening with their caregivers. Ask your caregiver whether you should be taking a calcium supplement or vitamin D to reduce the rate of osteoporosis.  Menopause can be associated with physical symptoms and risks. Hormone replacement therapy is available to decrease symptoms and risks. You should talk to your caregiver about whether hormone replacement therapy is right for you.  Use sunscreen. Apply sunscreen liberally and repeatedly throughout the day. You should seek shade when your shadow is shorter than you. Protect yourself by wearing long sleeves, pants, a wide-brimmed hat, and sunglasses year round, whenever you are outdoors.  Notify your caregiver of new moles or changes in moles, especially if there is a change in shape or color. Also notify your caregiver if a mole is larger than the size of a pencil eraser.  Stay current with your immunizations. Document Released: 09/10/2010 Document Revised: 06/22/2012 Document Reviewed: 09/10/2010 [facility] Patient Information 2014 [place].

## 2013-06-22 LAB — NMR, LIPOPROFILE
CHOLESTEROL: 161 mg/dL (ref <200)
HDL CHOLESTEROL BY NMR: 67 mg/dL (ref >=40)
HDL PARTICLE NUMBER: 46.3 umol/L (ref >=30.5)
LDL Particle Number: 892 nmol/L (ref <1000)
LDL Size: 20.3 nm (ref >20.5)
LDLC SERPL CALC-MCNC: 54 mg/dL (ref <100)
LP-IR SCORE: 50 — AB (ref <=45)
Small LDL Particle Number: 426 nmol/L (ref <=527)
TRIGLYCERIDES BY NMR: 200 mg/dL — AB (ref <150)

## 2013-06-22 LAB — THYROID PANEL WITH TSH
FREE THYROXINE INDEX: 2.9 (ref 1.2–4.9)
T3 UPTAKE RATIO: 28 % (ref 24–39)
T4, Total: 10.4 ug/dL (ref 4.5–12.0)
TSH: 0.846 u[IU]/mL (ref 0.450–4.500)

## 2013-06-22 LAB — CMP14+EGFR
ALK PHOS: 76 IU/L (ref 39–117)
ALT: 15 IU/L (ref 0–32)
AST: 18 IU/L (ref 0–40)
Albumin/Globulin Ratio: 1.8 (ref 1.1–2.5)
Albumin: 4.1 g/dL (ref 3.6–4.8)
BUN / CREAT RATIO: 18 (ref 11–26)
BUN: 16 mg/dL (ref 8–27)
CO2: 23 mmol/L (ref 18–29)
Calcium: 10.2 mg/dL (ref 8.7–10.3)
Chloride: 100 mmol/L (ref 97–108)
Creatinine, Ser: 0.9 mg/dL (ref 0.57–1.00)
GFR calc Af Amer: 80 mL/min/{1.73_m2} (ref >59)
GFR calc non Af Amer: 69 mL/min/{1.73_m2} (ref >59)
GLOBULIN, TOTAL: 2.3 g/dL (ref 1.5–4.5)
GLUCOSE: 110 mg/dL — AB (ref 65–99)
Potassium: 4.8 mmol/L (ref 3.5–5.2)
Sodium: 139 mmol/L (ref 134–144)
TOTAL PROTEIN: 6.4 g/dL (ref 6.0–8.5)
Total Bilirubin: 0.7 mg/dL (ref 0.0–1.2)

## 2013-07-09 ENCOUNTER — Other Ambulatory Visit: Payer: Self-pay | Admitting: *Deleted

## 2013-07-09 DIAGNOSIS — E785 Hyperlipidemia, unspecified: Secondary | ICD-10-CM

## 2013-07-09 MED ORDER — SIMVASTATIN 40 MG PO TABS: 40.0000 mg | ORAL_TABLET | Freq: Every day | ORAL | Status: DC

## 2013-09-22 ENCOUNTER — Encounter: Payer: Self-pay | Admitting: Nurse Practitioner

## 2013-09-22 ENCOUNTER — Ambulatory Visit (INDEPENDENT_AMBULATORY_CARE_PROVIDER_SITE_OTHER): Payer: BC Managed Care – PPO | Admitting: Nurse Practitioner

## 2013-09-22 VITALS — BP 118/80 | HR 70 | Temp 98.2°F | Ht 64.0 in | Wt 213.4 lb

## 2013-09-22 DIAGNOSIS — F329 Major depressive disorder, single episode, unspecified: Secondary | ICD-10-CM

## 2013-09-22 DIAGNOSIS — F411 Generalized anxiety disorder: Secondary | ICD-10-CM

## 2013-09-22 DIAGNOSIS — Z713 Dietary counseling and surveillance: Secondary | ICD-10-CM

## 2013-09-22 DIAGNOSIS — Z6836 Body mass index (BMI) 36.0-36.9, adult: Secondary | ICD-10-CM

## 2013-09-22 DIAGNOSIS — E785 Hyperlipidemia, unspecified: Secondary | ICD-10-CM

## 2013-09-22 DIAGNOSIS — F32A Depression, unspecified: Secondary | ICD-10-CM

## 2013-09-22 DIAGNOSIS — F3289 Other specified depressive episodes: Secondary | ICD-10-CM

## 2013-09-22 DIAGNOSIS — I10 Essential (primary) hypertension: Secondary | ICD-10-CM

## 2013-09-22 DIAGNOSIS — E890 Postprocedural hypothyroidism: Secondary | ICD-10-CM

## 2013-09-22 MED ORDER — LOSARTAN POTASSIUM 100 MG PO TABS: 100.0000 mg | ORAL_TABLET | Freq: Every day | ORAL | Status: DC

## 2013-09-22 MED ORDER — DULOXETINE HCL 30 MG PO CPEP: 30.0000 mg | ORAL_CAPSULE | Freq: Every day | ORAL | Status: DC

## 2013-09-22 MED ORDER — LEVOTHYROXINE SODIUM 50 MCG PO TABS: 50.0000 ug | ORAL_TABLET | Freq: Every day | ORAL | Status: DC

## 2013-09-22 MED ORDER — SIMVASTATIN 40 MG PO TABS: 40.0000 mg | ORAL_TABLET | Freq: Every day | ORAL | Status: DC

## 2013-09-22 NOTE — Patient Instructions (Signed)
Exercise to Lose Weight Exercise and a healthy diet may help you lose weight. Your doctor may suggest specific exercises. EXERCISE IDEAS AND TIPS  Choose low-cost things you enjoy doing, such as walking, bicycling, or exercising to workout videos.  Take stairs instead of the elevator.  Walk during your lunch break.  Park your car further away from work or school.  Go to a gym or an exercise class.  Start with 5 to 10 minutes of exercise each day. Build up to 30 minutes of exercise 4 to 6 days a week.  Wear shoes with good support and comfortable clothes.  Stretch before and after working out.  Work out until you breathe harder and your heart beats faster.  Drink extra water when you exercise.  Do not do so much that you hurt yourself, feel dizzy, or get very short of breath. Exercises that burn about 150 calories:  Running 1  miles in 15 minutes.  Playing volleyball for 45 to 60 minutes.  Washing and waxing a car for 45 to 60 minutes.  Playing touch football for 45 minutes.  Walking 1  miles in 35 minutes.  Pushing a stroller 1  miles in 30 minutes.  Playing basketball for 30 minutes.  Raking leaves for 30 minutes.  Bicycling 5 miles in 30 minutes.  Walking 2 miles in 30 minutes.  Dancing for 30 minutes.  Shoveling snow for 15 minutes.  Swimming laps for 20 minutes.  Walking up stairs for 15 minutes.  Bicycling 4 miles in 15 minutes.  Gardening for 30 to 45 minutes.  Jumping rope for 15 minutes.  Washing windows or floors for 45 to 60 minutes. Document Released: 03/30/2010 Document Revised: 05/20/2011 Document Reviewed: 03/30/2010 [employer] Patient Information 2015 [employer]. This information is not intended to replace advice given to you by your health care provider. Make sure you discuss any questions you have with your health care provider.  

## 2013-09-22 NOTE — Progress Notes (Signed)
Subjective:    Patient ID: Linda Hernandez, female    DOB: June 15, 1950, 63 y.o.   MRN: [id number]  Patient in today for chronic follow up- no changes since last visit.  Hypertension This is a chronic problem. The current episode started more than 1 year ago. The problem has been resolved since onset. The problem is controlled. Pertinent negatives include no blurred vision, chest pain, headaches, neck pain, palpitations, peripheral edema, shortness of breath or sweats. There are no associated agents to hypertension. Risk factors for coronary artery disease include dyslipidemia, obesity and post-menopausal state. Past treatments include diuretics. The current treatment provides significant improvement. Compliance problems include diet and exercise.  Hypertensive end-organ damage includes a thyroid problem.  Hyperlipidemia This is a chronic problem. The current episode started more than 1 year ago. The problem is controlled. Recent lipid tests were reviewed and are normal. Exacerbating diseases include hypothyroidism. Factors aggravating her hyperlipidemia include thiazides. Pertinent negatives include no chest pain, focal weakness, leg pain or shortness of breath. Current antihyperlipidemic treatment includes statins. The current treatment provides significant improvement of lipids. Compliance problems include adherence to diet and adherence to exercise.  Risk factors for coronary artery disease include hypertension, obesity and post-menopausal.  Thyroid Problem Presents for follow-up (hypothyroid) visit. Patient reports no anxiety, depressed mood, diaphoresis, diarrhea, dry skin, heat intolerance, hoarse voice, menstrual problem, nail problem, palpitations, visual change or weight loss. The symptoms have been stable. Her past medical history is significant for hyperlipidemia.  GAD Cymbalta working well- keeps her from worrying so much.   Review of Systems  Constitutional: Negative for weight loss and  diaphoresis.  HENT: Negative for hoarse voice.   Eyes: Negative for blurred vision.  Respiratory: Negative for shortness of breath.   Cardiovascular: Negative for chest pain and palpitations.  Gastrointestinal: Negative for diarrhea.  Endocrine: Negative for heat intolerance.  Genitourinary: Negative for menstrual problem.  Musculoskeletal: Negative for neck pain.  Neurological: Negative for focal weakness and headaches.  All other systems reviewed and are negative.      Objective:   Physical Exam  Constitutional: She is oriented to person, place, and time. She appears well-developed and well-nourished.  HENT:  Nose: Nose normal.  Mouth/Throat: Oropharynx is clear and moist.  Eyes: EOM are normal.  Neck: Trachea normal, normal range of motion and full passive range of motion without pain. Neck supple. No JVD present. Carotid bruit is not present. No thyromegaly present.  Cardiovascular: Normal rate, regular rhythm, normal heart sounds and intact distal pulses.  Exam reveals no gallop and no friction rub.   No murmur heard. Pulmonary/Chest: Effort normal and breath sounds normal.  Abdominal: Soft. Bowel sounds are normal. She exhibits no distension and no mass. There is no tenderness.  Musculoskeletal: Normal range of motion.  Lymphadenopathy:    She has no cervical adenopathy.  Neurological: She is alert and oriented to person, place, and time. She has normal reflexes.  Skin: Skin is warm and dry.  Psychiatric: She has a normal mood and affect. Her behavior is normal. Judgment and thought content normal.    BP 118/80  Pulse 70  Temp(Src) 98.2 F (36.8 C) (Oral)  Ht _0  (1.626 m)  Wt 213 lb 6.4 oz (96.798 kg)  BMI 36.61 kg/m2  LMP 06/01/1997       Assessment & Plan:   1. Essential hypertension   2. Hyperlipidemia   3. GAD (generalized anxiety disorder)   4. Postoperative hypothyroidism   5. Depression  6. BMI 36.0-36.9,adult   7. Weight loss counseling,  encounter for    Orders Placed This Encounter  Procedures  . CMP14+EGFR  . NMR, lipoprofile  . Thyroid Panel With TSH   Meds ordered this encounter  Medications  . simvastatin (ZOCOR) 40 MG tablet    Sig: Take 1 tablet (40 mg total) by mouth at bedtime.    Dispense:  90 tablet    Refill:  1    Order Specific Question:  Supervising Provider    Answer:  Chipper Herb [[id number]]  . losartan (COZAAR) 100 MG tablet    Sig: Take 1 tablet (100 mg total) by mouth daily.    Dispense:  90 tablet    Refill:  1    Order Specific Question:  Supervising Provider    Answer:  Chipper Herb [[id number]]  . levothyroxine (SYNTHROID, LEVOTHROID) 50 MCG tablet    Sig: Take 1 tablet (50 mcg total) by mouth daily.    Dispense:  90 tablet    Refill:  1    Order Specific Question:  Supervising Provider    Answer:  Chipper Herb [[id number]]  . DULoxetine (CYMBALTA) 30 MG capsule    Sig: Take 1 capsule (30 mg total) by mouth daily.    Dispense:  90 capsule    Refill:  1    Order Specific Question:  Supervising Provider    Answer:  Chipper Herb [place] pending Health maintenance reviewed Diet and exercise encouraged Continue all meds Follow up  In 3 months   [place], FNP

## 2013-09-23 LAB — CMP14+EGFR
A/G RATIO: 2 (ref 1.1–2.5)
ALT: 17 IU/L (ref 0–32)
AST: 18 IU/L (ref 0–40)
Albumin: 4.3 g/dL (ref 3.6–4.8)
Alkaline Phosphatase: 70 IU/L (ref 39–117)
BUN/Creatinine Ratio: 18 (ref 11–26)
BUN: 17 mg/dL (ref 8–27)
CHLORIDE: 99 mmol/L (ref 97–108)
CO2: 25 mmol/L (ref 18–29)
Calcium: 10.1 mg/dL (ref 8.7–10.3)
Creatinine, Ser: 0.96 mg/dL (ref 0.57–1.00)
GFR calc Af Amer: 74 mL/min/{1.73_m2} (ref >59)
GFR, EST NON AFRICAN AMERICAN: 64 mL/min/{1.73_m2} (ref >59)
Globulin, Total: 2.1 g/dL (ref 1.5–4.5)
Glucose: 108 mg/dL — ABNORMAL HIGH (ref 65–99)
Potassium: 4.8 mmol/L (ref 3.5–5.2)
Sodium: 140 mmol/L (ref 134–144)
TOTAL PROTEIN: 6.4 g/dL (ref 6.0–8.5)
Total Bilirubin: 0.6 mg/dL (ref 0.0–1.2)

## 2013-09-23 LAB — THYROID PANEL WITH TSH
FREE THYROXINE INDEX: 2.7 (ref 1.2–4.9)
T3 UPTAKE RATIO: 27 % (ref 24–39)
T4 TOTAL: 10.1 ug/dL (ref 4.5–12.0)
TSH: 1.21 u[IU]/mL (ref 0.450–4.500)

## 2013-09-23 LAB — NMR, LIPOPROFILE
Cholesterol: 158 mg/dL (ref 100–199)
HDL CHOLESTEROL BY NMR: 67 mg/dL (ref >39)
HDL Particle Number: 47.3 umol/L (ref >=30.5)
LDL PARTICLE NUMBER: 1065 nmol/L — AB (ref <1000)
LDL Size: 20.3 nm (ref >20.5)
LDLC SERPL CALC-MCNC: 65 mg/dL (ref 0–99)
LP-IR Score: 49 — ABNORMAL HIGH (ref <=45)
Small LDL Particle Number: 618 nmol/L — ABNORMAL HIGH (ref <=527)
TRIGLYCERIDES BY NMR: 128 mg/dL (ref 0–149)

## 2013-12-27 ENCOUNTER — Ambulatory Visit (INDEPENDENT_AMBULATORY_CARE_PROVIDER_SITE_OTHER): Payer: BC Managed Care – PPO | Admitting: Nurse Practitioner

## 2013-12-27 ENCOUNTER — Encounter: Payer: Self-pay | Admitting: Nurse Practitioner

## 2013-12-27 VITALS — BP 144/79 | HR 81 | Temp 97.3°F | Ht 64.0 in | Wt 213.0 lb

## 2013-12-27 DIAGNOSIS — E890 Postprocedural hypothyroidism: Secondary | ICD-10-CM

## 2013-12-27 DIAGNOSIS — F329 Major depressive disorder, single episode, unspecified: Secondary | ICD-10-CM

## 2013-12-27 DIAGNOSIS — I10 Essential (primary) hypertension: Secondary | ICD-10-CM

## 2013-12-27 DIAGNOSIS — F32A Depression, unspecified: Secondary | ICD-10-CM | POA: Insufficient documentation

## 2013-12-27 DIAGNOSIS — F411 Generalized anxiety disorder: Secondary | ICD-10-CM

## 2013-12-27 DIAGNOSIS — Z23 Encounter for immunization: Secondary | ICD-10-CM

## 2013-12-27 DIAGNOSIS — E785 Hyperlipidemia, unspecified: Secondary | ICD-10-CM

## 2013-12-27 MED ORDER — SIMVASTATIN 40 MG PO TABS: 40.0000 mg | ORAL_TABLET | Freq: Every day | ORAL | Status: DC

## 2013-12-27 MED ORDER — LOSARTAN POTASSIUM 100 MG PO TABS: 100.0000 mg | ORAL_TABLET | Freq: Every day | ORAL | Status: DC

## 2013-12-27 MED ORDER — DULOXETINE HCL 30 MG PO CPEP: 30.0000 mg | ORAL_CAPSULE | Freq: Every day | ORAL | Status: DC

## 2013-12-27 MED ORDER — LEVOTHYROXINE SODIUM 50 MCG PO TABS: 50.0000 ug | ORAL_TABLET | Freq: Every day | ORAL | Status: DC

## 2013-12-27 NOTE — Patient Instructions (Signed)

## 2013-12-27 NOTE — Progress Notes (Signed)
Subjective:    Patient ID: Linda Hernandez, female    DOB: 10/16/1950, 63 y.o.   MRN: [id number]  Patient in today for chronic follow up- no changes since last visit.  Hypertension This is a chronic problem. The current episode started more than 1 year ago. The problem has been resolved since onset. The problem is controlled. Pertinent negatives include no blurred vision, chest pain, headaches, neck pain, palpitations, peripheral edema, shortness of breath or sweats. There are no associated agents to hypertension. Risk factors for coronary artery disease include dyslipidemia, obesity and post-menopausal state. Past treatments include diuretics. The current treatment provides significant improvement. Compliance problems include diet and exercise.  Hypertensive end-organ damage includes a thyroid problem.  Hyperlipidemia This is a chronic problem. The current episode started more than 1 year ago. The problem is controlled. Recent lipid tests were reviewed and are normal. Exacerbating diseases include hypothyroidism. Factors aggravating her hyperlipidemia include thiazides. Pertinent negatives include no chest pain, focal weakness, leg pain or shortness of breath. Current antihyperlipidemic treatment includes statins. The current treatment provides significant improvement of lipids. Compliance problems include adherence to diet and adherence to exercise.  Risk factors for coronary artery disease include hypertension, obesity and post-menopausal.  Thyroid Problem Presents for follow-up (hypothyroid) visit. Patient reports no anxiety, depressed mood, diaphoresis, diarrhea, dry skin, heat intolerance, hoarse voice, menstrual problem, nail problem, palpitations, visual change or weight loss. The symptoms have been stable. Her past medical history is significant for hyperlipidemia.  GAD Cymbalta working well- keeps her from worrying so much.   Review of Systems  Constitutional: Negative for weight loss and  diaphoresis.  HENT: Negative for hoarse voice.   Eyes: Negative for blurred vision.  Respiratory: Negative for shortness of breath.   Cardiovascular: Negative for chest pain and palpitations.  Gastrointestinal: Negative for diarrhea.  Endocrine: Negative for heat intolerance.  Genitourinary: Negative for menstrual problem.  Musculoskeletal: Negative for neck pain.  Neurological: Negative for focal weakness and headaches.  All other systems reviewed and are negative.      Objective:   Physical Exam  Constitutional: She is oriented to person, place, and time. She appears well-developed and well-nourished.  HENT:  Nose: Nose normal.  Mouth/Throat: Oropharynx is clear and moist.  Eyes: EOM are normal.  Neck: Trachea normal, normal range of motion and full passive range of motion without pain. Neck supple. No JVD present. Carotid bruit is not present. No thyromegaly present.  Cardiovascular: Normal rate, regular rhythm, normal heart sounds and intact distal pulses.  Exam reveals no gallop and no friction rub.   No murmur heard. Pulmonary/Chest: Effort normal and breath sounds normal.  Abdominal: Soft. Bowel sounds are normal. She exhibits no distension and no mass. There is no tenderness.  Musculoskeletal: Normal range of motion.  Lymphadenopathy:    She has no cervical adenopathy.  Neurological: She is alert and oriented to person, place, and time. She has normal reflexes.  Skin: Skin is warm and dry.  Psychiatric: She has a normal mood and affect. Her behavior is normal. Judgment and thought content normal.    BP 144/79  Pulse 81  Temp(Src) 97.3 F (36.3 C) (Oral)  Ht _0  (1.626 m)  Wt 213 lb (96.616 kg)  BMI 36.54 kg/m2  LMP 06/01/1997       Assessment & Plan:   1. Postoperative hypothyroidism - levothyroxine (SYNTHROID, LEVOTHROID) 50 MCG tablet; Take 1 tablet (50 mcg total) by mouth daily.  Dispense: 90 tablet; Refill: 1 -  Thyroid Panel With TSH  2. Essential  hypertension Low NA+ diet - losartan (COZAAR) 100 MG tablet; Take 1 tablet (100 mg total) by mouth daily.  Dispense: 90 tablet; Refill: 1 - CMP14+EGFR  3. Hyperlipidemia Low fat diet - simvastatin (ZOCOR) 40 MG tablet; Take 1 tablet (40 mg total) by mouth at bedtime.  Dispense: 90 tablet; Refill: 1 - NMR, lipoprofile  4. GAD (generalized anxiety disorder) Stress management  5. Depression - DULoxetine (CYMBALTA) 30 MG capsule; Take 1 capsule (30 mg total) by mouth daily.  Dispense: 90 capsule; Refill: 1  6. Need for Tdap vaccination - Tdap vaccine greater than or equal to 7yo IM   Flu shot today Labs pending Health maintenance reviewed Diet and exercise encouraged Continue all meds Follow up  In 3 months   [place], FNP

## 2013-12-28 LAB — CMP14+EGFR
A/G RATIO: 1.9 (ref 1.1–2.5)
ALBUMIN: 4.3 g/dL (ref 3.6–4.8)
ALT: 15 IU/L (ref 0–32)
AST: 18 IU/L (ref 0–40)
Alkaline Phosphatase: 78 IU/L (ref 39–117)
BILIRUBIN TOTAL: 0.7 mg/dL (ref 0.0–1.2)
BUN/Creatinine Ratio: 17 (ref 11–26)
BUN: 16 mg/dL (ref 8–27)
CO2: 25 mmol/L (ref 18–29)
Calcium: 10.1 mg/dL (ref 8.7–10.3)
Chloride: 99 mmol/L (ref 97–108)
Creatinine, Ser: 0.96 mg/dL (ref 0.57–1.00)
GFR, EST AFRICAN AMERICAN: 74 mL/min/{1.73_m2} (ref >59)
GFR, EST NON AFRICAN AMERICAN: 64 mL/min/{1.73_m2} (ref >59)
GLOBULIN, TOTAL: 2.3 g/dL (ref 1.5–4.5)
Glucose: 139 mg/dL — ABNORMAL HIGH (ref 65–99)
POTASSIUM: 4.3 mmol/L (ref 3.5–5.2)
Sodium: 138 mmol/L (ref 134–144)
TOTAL PROTEIN: 6.6 g/dL (ref 6.0–8.5)

## 2013-12-28 LAB — NMR, LIPOPROFILE
CHOLESTEROL: 163 mg/dL (ref 100–199)
HDL Cholesterol by NMR: 67 mg/dL (ref >39)
HDL Particle Number: 47.4 umol/L (ref >=30.5)
LDL Particle Number: 1050 nmol/L — ABNORMAL HIGH (ref <1000)
LDL Size: 20.5 nm (ref >20.5)
LDLC SERPL CALC-MCNC: 61 mg/dL (ref 0–99)
LP-IR Score: 51 — ABNORMAL HIGH (ref <=45)
SMALL LDL PARTICLE NUMBER: 621 nmol/L — AB (ref <=527)
TRIGLYCERIDES BY NMR: 177 mg/dL — AB (ref 0–149)

## 2013-12-28 LAB — THYROID PANEL WITH TSH
Free Thyroxine Index: 3.2 (ref 1.2–4.9)
T3 Uptake Ratio: 29 % (ref 24–39)
T4, Total: 10.9 ug/dL (ref 4.5–12.0)
TSH: 0.686 u[IU]/mL (ref 0.450–4.500)

## 2014-03-31 ENCOUNTER — Encounter: Payer: Self-pay | Admitting: Nurse Practitioner

## 2014-03-31 ENCOUNTER — Ambulatory Visit (INDEPENDENT_AMBULATORY_CARE_PROVIDER_SITE_OTHER): Payer: BLUE CROSS/BLUE SHIELD | Admitting: Nurse Practitioner

## 2014-03-31 VITALS — BP 132/84 | HR 84 | Temp 96.8°F | Ht 64.0 in | Wt 212.0 lb

## 2014-03-31 DIAGNOSIS — F329 Major depressive disorder, single episode, unspecified: Secondary | ICD-10-CM

## 2014-03-31 DIAGNOSIS — E785 Hyperlipidemia, unspecified: Secondary | ICD-10-CM

## 2014-03-31 DIAGNOSIS — E890 Postprocedural hypothyroidism: Secondary | ICD-10-CM

## 2014-03-31 DIAGNOSIS — F411 Generalized anxiety disorder: Secondary | ICD-10-CM

## 2014-03-31 DIAGNOSIS — I10 Essential (primary) hypertension: Secondary | ICD-10-CM

## 2014-03-31 DIAGNOSIS — F32A Depression, unspecified: Secondary | ICD-10-CM

## 2014-03-31 NOTE — Patient Instructions (Signed)
Exercise to Lose Weight Exercise and a healthy diet may help you lose weight. Your doctor may suggest specific exercises. EXERCISE IDEAS AND TIPS  Choose low-cost things you enjoy doing, such as walking, bicycling, or exercising to workout videos.  Take stairs instead of the elevator.  Walk during your lunch break.  Park your car further away from work or school.  Go to a gym or an exercise class.  Start with 5 to 10 minutes of exercise each day. Build up to 30 minutes of exercise 4 to 6 days a week.  Wear shoes with good support and comfortable clothes.  Stretch before and after working out.  Work out until you breathe harder and your heart beats faster.  Drink extra water when you exercise.  Do not do so much that you hurt yourself, feel dizzy, or get very short of breath. Exercises that burn about 150 calories:  Running 1  miles in 15 minutes.  Playing volleyball for 45 to 60 minutes.  Washing and waxing a car for 45 to 60 minutes.  Playing touch football for 45 minutes.  Walking 1  miles in 35 minutes.  Pushing a stroller 1  miles in 30 minutes.  Playing basketball for 30 minutes.  Raking leaves for 30 minutes.  Bicycling 5 miles in 30 minutes.  Walking 2 miles in 30 minutes.  Dancing for 30 minutes.  Shoveling snow for 15 minutes.  Swimming laps for 20 minutes.  Walking up stairs for 15 minutes.  Bicycling 4 miles in 15 minutes.  Gardening for 30 to 45 minutes.  Jumping rope for 15 minutes.  Washing windows or floors for 45 to 60 minutes. Document Released: 03/30/2010 Document Revised: 05/20/2011 Document Reviewed: 03/30/2010 [employer] Patient Information 2015 [employer]. This information is not intended to replace advice given to you by your health care provider. Make sure you discuss any questions you have with your health care provider.  

## 2014-03-31 NOTE — Progress Notes (Signed)
  Subjective:    Patient ID: Linda Hernandez, female    DOB: 17-Feb-1951, 64 y.o.   MRN: [id number]  Patient in today for chronic follow up- no changes since last visit.  Hypertension This is a chronic problem. The current episode started more than 1 year ago. The problem is unchanged. The problem is uncontrolled. Pertinent negatives include no chest pain, headaches, neck pain, palpitations or shortness of breath. Risk factors for coronary artery disease include dyslipidemia, obesity and post-menopausal state. Past treatments include angiotensin blockers. The current treatment provides moderate improvement. Compliance problems include diet and exercise.  Hypertensive end-organ damage includes a thyroid problem.  Hyperlipidemia This is a chronic problem. The current episode started more than 1 year ago. The problem is resistant. Recent lipid tests were reviewed and are variable. Pertinent negatives include no chest pain or shortness of breath. Current antihyperlipidemic treatment includes statins. The current treatment provides moderate improvement of lipids. Compliance problems include adherence to diet and adherence to exercise.  Risk factors for coronary artery disease include dyslipidemia, hypertension, obesity and post-menopausal.  Thyroid Problem Visit type: hypothyroidism. Patient reports no diaphoresis, diarrhea, heat intolerance, menstrual problem, palpitations or visual change. Her past medical history is significant for hyperlipidemia.  GAD Cymbalta working well- keeps her from worrying so much.   Review of Systems  Constitutional: Negative for diaphoresis.  Respiratory: Negative for shortness of breath.   Cardiovascular: Negative for chest pain and palpitations.  Gastrointestinal: Negative for diarrhea.  Endocrine: Negative for heat intolerance.  Genitourinary: Negative for menstrual problem.  Musculoskeletal: Negative for neck pain.  Neurological: Negative for headaches.  All other  systems reviewed and are negative.      Objective:   Physical Exam  Constitutional: She is oriented to person, place, and time. She appears well-developed and well-nourished.  HENT:  Nose: Nose normal.  Mouth/Throat: Oropharynx is clear and moist.  Eyes: EOM are normal.  Neck: Trachea normal, normal range of motion and full passive range of motion without pain. Neck supple. No JVD present. Carotid bruit is not present. No thyromegaly present.  Cardiovascular: Normal rate, regular rhythm, normal heart sounds and intact distal pulses.  Exam reveals no gallop and no friction rub.   No murmur heard. Pulmonary/Chest: Effort normal and breath sounds normal.  Abdominal: Soft. Bowel sounds are normal. She exhibits no distension and no mass. There is no tenderness.  Musculoskeletal: Normal range of motion.  Lymphadenopathy:    She has no cervical adenopathy.  Neurological: She is alert and oriented to person, place, and time. She has normal reflexes.  Skin: Skin is warm and dry.  Psychiatric: She has a normal mood and affect. Her behavior is normal. Judgment and thought content normal.    BP 132/84 mmHg  Pulse 84  Temp(Src) 96.8 F (36 C) (Oral)  Ht _0  (1.626 m)  Wt 212 lb (96.163 kg)  BMI 36.37 kg/m2  LMP 06/01/1997        Assessment & Plan:    1. Essential hypertension Do not add salt to diet - CMP14+EGFR - DG Chest 2 View; Future - EKG 12-Lead  2. Postoperative hypothyroidism - Thyroid Panel With TSH  3. Hyperlipidemia Low fat diet - NMR, lipoprofile  4. GAD (generalized anxiety disorder) Stress management  5. Depression   hemoccult cards given to patient with directions Labs pending Health maintenance reviewed Diet and exercise encouraged Continue all meds Follow up  In 3 months   [place], FNP

## 2014-04-01 LAB — NMR, LIPOPROFILE
Cholesterol: 178 mg/dL (ref 100–199)
HDL Cholesterol by NMR: 71 mg/dL (ref >39)
HDL PARTICLE NUMBER: 50.3 umol/L (ref >=30.5)
LDL Particle Number: 875 nmol/L (ref <1000)
LDL Size: 20.5 nm (ref >20.5)
LDL-C: 69 mg/dL (ref 0–99)
LP-IR Score: 46 — ABNORMAL HIGH (ref <=45)
SMALL LDL PARTICLE NUMBER: 457 nmol/L (ref <=527)
Triglycerides by NMR: 188 mg/dL — ABNORMAL HIGH (ref 0–149)

## 2014-04-01 LAB — CMP14+EGFR
A/G RATIO: 1.7 (ref 1.1–2.5)
ALT: 19 IU/L (ref 0–32)
AST: 19 IU/L (ref 0–40)
Albumin: 4.4 g/dL (ref 3.6–4.8)
Alkaline Phosphatase: 82 IU/L (ref 39–117)
BILIRUBIN TOTAL: 0.7 mg/dL (ref 0.0–1.2)
BUN/Creatinine Ratio: 17 (ref 11–26)
BUN: 16 mg/dL (ref 8–27)
CO2: 28 mmol/L (ref 18–29)
Calcium: 10.4 mg/dL — ABNORMAL HIGH (ref 8.7–10.3)
Chloride: 98 mmol/L (ref 97–108)
Creatinine, Ser: 0.92 mg/dL (ref 0.57–1.00)
GFR calc Af Amer: 78 mL/min/{1.73_m2} (ref >59)
GFR calc non Af Amer: 67 mL/min/{1.73_m2} (ref >59)
GLUCOSE: 122 mg/dL — AB (ref 65–99)
Globulin, Total: 2.6 g/dL (ref 1.5–4.5)
Potassium: 5 mmol/L (ref 3.5–5.2)
SODIUM: 141 mmol/L (ref 134–144)
TOTAL PROTEIN: 7 g/dL (ref 6.0–8.5)

## 2014-04-01 LAB — THYROID PANEL WITH TSH
Free Thyroxine Index: 3.1 (ref 1.2–4.9)
T3 Uptake Ratio: 27 % (ref 24–39)
T4, Total: 11.3 ug/dL (ref 4.5–12.0)
TSH: 0.763 u[IU]/mL (ref 0.450–4.500)

## 2014-04-04 ENCOUNTER — Encounter: Payer: Self-pay | Admitting: Family Medicine

## 2014-04-11 ENCOUNTER — Other Ambulatory Visit: Payer: BLUE CROSS/BLUE SHIELD

## 2014-04-11 DIAGNOSIS — Z1212 Encounter for screening for malignant neoplasm of rectum: Secondary | ICD-10-CM

## 2014-04-11 NOTE — Progress Notes (Signed)
Lab only 

## 2014-04-14 LAB — FECAL OCCULT BLOOD, IMMUNOCHEMICAL: Fecal Occult Bld: NEGATIVE

## 2014-07-04 ENCOUNTER — Ambulatory Visit (INDEPENDENT_AMBULATORY_CARE_PROVIDER_SITE_OTHER): Payer: BLUE CROSS/BLUE SHIELD | Admitting: Nurse Practitioner

## 2014-07-04 ENCOUNTER — Encounter: Payer: Self-pay | Admitting: Nurse Practitioner

## 2014-07-04 VITALS — BP 134/70 | HR 66 | Temp 97.1°F | Ht 64.0 in | Wt 215.0 lb

## 2014-07-04 DIAGNOSIS — F32A Depression, unspecified: Secondary | ICD-10-CM

## 2014-07-04 DIAGNOSIS — E785 Hyperlipidemia, unspecified: Secondary | ICD-10-CM | POA: Diagnosis not present

## 2014-07-04 DIAGNOSIS — E890 Postprocedural hypothyroidism: Secondary | ICD-10-CM

## 2014-07-04 DIAGNOSIS — F329 Major depressive disorder, single episode, unspecified: Secondary | ICD-10-CM | POA: Diagnosis not present

## 2014-07-04 DIAGNOSIS — F411 Generalized anxiety disorder: Secondary | ICD-10-CM

## 2014-07-04 DIAGNOSIS — I10 Essential (primary) hypertension: Secondary | ICD-10-CM | POA: Diagnosis not present

## 2014-07-04 MED ORDER — DULOXETINE HCL 30 MG PO CPEP: 30.0000 mg | ORAL_CAPSULE | Freq: Every day | ORAL | Status: DC

## 2014-07-04 MED ORDER — SIMVASTATIN 40 MG PO TABS: 40.0000 mg | ORAL_TABLET | Freq: Every day | ORAL | Status: DC

## 2014-07-04 MED ORDER — LEVOTHYROXINE SODIUM 50 MCG PO TABS
50.0000 ug | ORAL_TABLET | Freq: Every day | ORAL | Status: DC
Start: 2014-07-04 — End: 2014-10-10

## 2014-07-04 MED ORDER — LOSARTAN POTASSIUM 100 MG PO TABS: 100.0000 mg | ORAL_TABLET | Freq: Every day | ORAL | Status: DC

## 2014-07-04 NOTE — Patient Instructions (Signed)
Exercise to Stay Healthy Exercise helps you become and stay healthy. EXERCISE IDEAS AND TIPS Choose exercises that:  You enjoy.  Fit into your day. You do not need to exercise really hard to be healthy. You can do exercises at a slow or medium level and stay healthy. You can:  Stretch before and after working out.  Try yoga, Pilates, or tai chi.  Lift weights.  Walk fast, swim, jog, run, climb stairs, bicycle, dance, or rollerskate.  Take aerobic classes. Exercises that burn about 150 calories:  Running 1  miles in 15 minutes.  Playing volleyball for 45 to 60 minutes.  Washing and waxing a car for 45 to 60 minutes.  Playing touch football for 45 minutes.  Walking 1  miles in 35 minutes.  Pushing a stroller 1  miles in 30 minutes.  Playing basketball for 30 minutes.  Raking leaves for 30 minutes.  Bicycling 5 miles in 30 minutes.  Walking 2 miles in 30 minutes.  Dancing for 30 minutes.  Shoveling snow for 15 minutes.  Swimming laps for 20 minutes.  Walking up stairs for 15 minutes.  Bicycling 4 miles in 15 minutes.  Gardening for 30 to 45 minutes.  Jumping rope for 15 minutes.  Washing windows or floors for 45 to 60 minutes. Document Released: 03/30/2010 Document Revised: 05/20/2011 Document Reviewed: 03/30/2010 Exit[employer] Patient Information 2015 [employer]. This information is not intended to replace advice given to you by your health care provider. Make sure you discuss any questions you have with your health care provider.  

## 2014-07-04 NOTE — Progress Notes (Signed)
  Subjective:    Patient ID: Linda Hernandez, female    DOB: 1950-07-27, 63 y.o.   MRN: [id number]  Patient in today for chronic follow up- no changes since last visit.  Hypertension This is a chronic problem. The current episode started more than 1 year ago. The problem is unchanged. The problem is uncontrolled. Pertinent negatives include no chest pain, headaches, neck pain, palpitations or shortness of breath. Risk factors for coronary artery disease include dyslipidemia, obesity and post-menopausal state. Past treatments include angiotensin blockers. The current treatment provides moderate improvement. Compliance problems include diet and exercise.  Hypertensive end-organ damage includes a thyroid problem.  Hyperlipidemia This is a chronic problem. The current episode started more than 1 year ago. The problem is resistant. Recent lipid tests were reviewed and are variable. Pertinent negatives include no chest pain or shortness of breath. Current antihyperlipidemic treatment includes statins. The current treatment provides moderate improvement of lipids. Compliance problems include adherence to diet and adherence to exercise.  Risk factors for coronary artery disease include dyslipidemia, hypertension, obesity and post-menopausal.  Thyroid Problem Visit type: hypothyroidism. Patient reports no diaphoresis, diarrhea, heat intolerance, menstrual problem, palpitations or visual change. Her past medical history is significant for hyperlipidemia.  GAD Cymbalta working well- keeps her from worrying so much.   Review of Systems  Constitutional: Negative for diaphoresis.  Respiratory: Negative for shortness of breath.   Cardiovascular: Negative for chest pain and palpitations.  Gastrointestinal: Negative for diarrhea.  Endocrine: Negative for heat intolerance.  Genitourinary: Negative for menstrual problem.  Musculoskeletal: Negative for neck pain.  Neurological: Negative for headaches.  All other  systems reviewed and are negative.      Objective:   Physical Exam  Constitutional: She is oriented to person, place, and time. She appears well-developed and well-nourished.  HENT:  Nose: Nose normal.  Mouth/Throat: Oropharynx is clear and moist.  Eyes: EOM are normal.  Neck: Trachea normal, normal range of motion and full passive range of motion without pain. Neck supple. No JVD present. Carotid bruit is not present. No thyromegaly present.  Cardiovascular: Normal rate, regular rhythm, normal heart sounds and intact distal pulses.  Exam reveals no gallop and no friction rub.   No murmur heard. Pulmonary/Chest: Effort normal and breath sounds normal.  Abdominal: Soft. Bowel sounds are normal. She exhibits no distension and no mass. There is no tenderness.  Musculoskeletal: Normal range of motion.  Lymphadenopathy:    She has no cervical adenopathy.  Neurological: She is alert and oriented to person, place, and time. She has normal reflexes.  Skin: Skin is warm and dry.  Psychiatric: She has a normal mood and affect. Her behavior is normal. Judgment and thought content normal.    BP 134/70 mmHg  Pulse 66  Temp(Src) 97.1 F (36.2 C) (Oral)  Ht 5' 4" (1.626 m)  Wt 215 lb (97.523 kg)  BMI 36.89 kg/m2  LMP 06/01/1997   EKG- NSR- [name] Done, FNP      Assessment & Plan:    1. Essential hypertension Do not add slat to diet - CMP14+EGFR - EKG 12-Lead  2. Postoperative hypothyroidism - Thyroid Panel With TSH  3. Hyperlipidemia Low fta diet - NMR, lipoprofile  4. GAD (generalized anxiety disorder) Stress management  5. Depression    Labs pending Health maintenance reviewed Diet and exercise encouraged Continue all meds Follow up  In 3 month    [place], FNP

## 2014-07-05 LAB — CMP14+EGFR
A/G RATIO: 1.9 (ref 1.1–2.5)
ALT: 20 IU/L (ref 0–32)
AST: 21 IU/L (ref 0–40)
Albumin: 4.3 g/dL (ref 3.6–4.8)
Alkaline Phosphatase: 75 IU/L (ref 39–117)
BILIRUBIN TOTAL: 0.6 mg/dL (ref 0.0–1.2)
BUN/Creatinine Ratio: 17 (ref 11–26)
BUN: 16 mg/dL (ref 8–27)
CO2: 26 mmol/L (ref 18–29)
Calcium: 10.1 mg/dL (ref 8.7–10.3)
Chloride: 102 mmol/L (ref 97–108)
Creatinine, Ser: 0.93 mg/dL (ref 0.57–1.00)
GFR, EST AFRICAN AMERICAN: 76 mL/min/{1.73_m2} (ref >59)
GFR, EST NON AFRICAN AMERICAN: 66 mL/min/{1.73_m2} (ref >59)
GLUCOSE: 111 mg/dL — AB (ref 65–99)
Globulin, Total: 2.3 g/dL (ref 1.5–4.5)
Potassium: 5 mmol/L (ref 3.5–5.2)
Sodium: 142 mmol/L (ref 134–144)
Total Protein: 6.6 g/dL (ref 6.0–8.5)

## 2014-07-05 LAB — NMR, LIPOPROFILE
Cholesterol: 160 mg/dL (ref 100–199)
HDL Cholesterol by NMR: 75 mg/dL (ref >39)
HDL Particle Number: 47.9 umol/L (ref >=30.5)
LDL Particle Number: 874 nmol/L (ref <1000)
LDL Size: 20.5 nm (ref >20.5)
LDL-C: 48 mg/dL (ref 0–99)
LP-IR SCORE: 46 — AB (ref <=45)
SMALL LDL PARTICLE NUMBER: 471 nmol/L (ref <=527)
Triglycerides by NMR: 184 mg/dL — ABNORMAL HIGH (ref 0–149)

## 2014-07-05 LAB — THYROID PANEL WITH TSH
FREE THYROXINE INDEX: 3.2 (ref 1.2–4.9)
T3 Uptake Ratio: 29 % (ref 24–39)
T4, Total: 10.9 ug/dL (ref 4.5–12.0)
TSH: 1.04 u[IU]/mL (ref 0.450–4.500)

## 2014-08-26 ENCOUNTER — Encounter: Payer: Self-pay | Admitting: Family Medicine

## 2014-10-10 ENCOUNTER — Ambulatory Visit (INDEPENDENT_AMBULATORY_CARE_PROVIDER_SITE_OTHER): Payer: BLUE CROSS/BLUE SHIELD | Admitting: Nurse Practitioner

## 2014-10-10 ENCOUNTER — Encounter: Payer: Self-pay | Admitting: Nurse Practitioner

## 2014-10-10 VITALS — BP 136/75 | HR 62 | Temp 98.4°F | Ht 62.0 in | Wt 214.2 lb

## 2014-10-10 DIAGNOSIS — Z6839 Body mass index (BMI) 39.0-39.9, adult: Secondary | ICD-10-CM

## 2014-10-10 DIAGNOSIS — F329 Major depressive disorder, single episode, unspecified: Secondary | ICD-10-CM

## 2014-10-10 DIAGNOSIS — F411 Generalized anxiety disorder: Secondary | ICD-10-CM

## 2014-10-10 DIAGNOSIS — I10 Essential (primary) hypertension: Secondary | ICD-10-CM | POA: Diagnosis not present

## 2014-10-10 DIAGNOSIS — E785 Hyperlipidemia, unspecified: Secondary | ICD-10-CM | POA: Diagnosis not present

## 2014-10-10 DIAGNOSIS — E890 Postprocedural hypothyroidism: Secondary | ICD-10-CM

## 2014-10-10 DIAGNOSIS — F32A Depression, unspecified: Secondary | ICD-10-CM

## 2014-10-10 MED ORDER — LEVOTHYROXINE SODIUM 50 MCG PO TABS: 50.0000 ug | ORAL_TABLET | Freq: Every day | ORAL | Status: DC

## 2014-10-10 MED ORDER — LOSARTAN POTASSIUM 100 MG PO TABS: 100.0000 mg | ORAL_TABLET | Freq: Every day | ORAL | Status: DC

## 2014-10-10 MED ORDER — DULOXETINE HCL 30 MG PO CPEP: 30.0000 mg | ORAL_CAPSULE | Freq: Every day | ORAL | Status: DC

## 2014-10-10 MED ORDER — SIMVASTATIN 40 MG PO TABS: 40.0000 mg | ORAL_TABLET | Freq: Every day | ORAL | Status: DC

## 2014-10-10 NOTE — Patient Instructions (Signed)
Bone Health Our bones do many things. They provide structure, protect organs, anchor muscles, and store calcium. Adequate calcium in your diet and weight-bearing physical activity help build strong bones, improve bone amounts, and may reduce the risk of weakening of bones (osteoporosis) later in life. PEAK BONE MASS By age 62, the average woman has acquired most of her skeletal bone mass. A large decline occurs in older adults which increases the risk of osteoporosis. In women this occurs around the time of menopause. It is important for young girls to reach their peak bone mass in order to maintain bone health throughout life. A person with high bone mass as a young adult will be more likely to have a higher bone mass later in life. Not enough calcium consumption and physical activity early on could result in a failure to achieve optimum bone mass in adulthood. OSTEOPOROSIS Osteoporosis is a disease of the bones. It is defined as low bone mass with deterioration of bone structure. Osteoporosis leads to an increase risk of fractures with falls. These fractures commonly happen in the wrist, hip, and spine. While men and women of all ages and background can develop osteoporosis, some of the risk factors for osteoporosis are:  Female.  White.  Postmenopausal.  Older adults.  Small in body size.  Eating a diet low in calcium.  Physically inactive.  Smoking.  Use of some medications.  Family history. CALCIUM Calcium is a mineral needed by the body for healthy bones, teeth, and proper function of the heart, muscles, and nerves. The body cannot produce calcium so it must be absorbed through food. Good sources of calcium include:  Dairy products (low fat or nonfat milk, cheese, and yogurt).  Dark green leafy vegetables (bok choy and broccoli).  Calcium fortified foods (orange juice, cereal, bread, soy beverages, and tofu products).  Nuts (almonds). Recommended amounts of calcium vary  for individuals. RECOMMENDED CALCIUM INTAKES Age and Amount in mg per day  Children 1 to 3 years / 700 mg  Children 4 to 8 years / 1,000 mg  Children 9 to 13 years / 1,300 mg  Teens 14 to 18 years / 1,300 mg  Adults 19 to 50 years / 1,000 mg  Adult women 51 to 70 years / 1,200 mg  Adults 71 years and older / 1,200 mg  Pregnant and breastfeeding teens / 1,300 mg  Pregnant and breastfeeding adults / 1,000 mg Vitamin D also plays an important role in healthy bone development. Vitamin D helps in the absorption of calcium. WEIGHT-BEARING PHYSICAL ACTIVITY Regular physical activity has many positive health benefits. Benefits include strong bones. Weight-bearing physical activity early in life is important in reaching peak bone mass. Weight-bearing physical activities cause muscles and bones to work against gravity. Some examples of weight bearing physical activities include:  Walking, jogging, or running.  Field Hockey.  Jumping rope.  Dancing.  Soccer.  Tennis or Racquetball.  Stair climbing.  Basketball.  Hiking.  Weight lifting.  Aerobic fitness classes. Including weight-bearing physical activity into an exercise plan is a great way to keep bones healthy. Adults: Engage in at least 30 minutes of moderate physical activity on most, preferably all, days of the week. Children: Engage in at least 60 minutes of moderate physical activity on most, preferably all, days of the week. FOR MORE INFORMATION [place] [employer], Center for Nutrition Policy and Promotion: www.cnpp.usda.gov National Osteoporosis Foundation: www.nof.org Document Released: 05/18/2003 Document Revised: 06/22/2012 Document Reviewed: 08/17/2008 ExitCare Patient Information   2015 [employer]. This information is not intended to replace advice given to you by your health care provider. Make sure you discuss any questions you have with your health care provider.  

## 2014-10-10 NOTE — Progress Notes (Signed)
Subjective:    Patient ID: Linda Hernandez, female    DOB: Sep 29, 1950, 64 y.o.   MRN: [id number]  Patient in today for chronic follow up- no changes since last visit.  Hypertension This is a chronic problem. The current episode started more than 1 year ago. The problem is unchanged. The problem is uncontrolled. Pertinent negatives include no chest pain, headaches, neck pain, palpitations or shortness of breath. Risk factors for coronary artery disease include dyslipidemia, obesity and post-menopausal state. Past treatments include angiotensin blockers. The current treatment provides moderate improvement. Compliance problems include diet and exercise.  Hypertensive end-organ damage includes a thyroid problem.  Hyperlipidemia This is a chronic problem. The current episode started more than 1 year ago. The problem is resistant. Recent lipid tests were reviewed and are variable. Pertinent negatives include no chest pain or shortness of breath. Current antihyperlipidemic treatment includes statins. The current treatment provides moderate improvement of lipids. Compliance problems include adherence to diet and adherence to exercise.  Risk factors for coronary artery disease include dyslipidemia, hypertension, obesity and post-menopausal.  Thyroid Problem Visit type: hypothyroidism. Patient reports no diaphoresis, diarrhea, heat intolerance, menstrual problem, palpitations or visual change. Her past medical history is significant for hyperlipidemia.  GAD Cymbalta working well- keeps her from worrying so much.   Review of Systems  Constitutional: Negative for diaphoresis.  Respiratory: Negative for shortness of breath.   Cardiovascular: Negative for chest pain and palpitations.  Gastrointestinal: Negative for diarrhea.  Endocrine: Negative for heat intolerance.  Genitourinary: Negative for menstrual problem.  Musculoskeletal: Negative for neck pain.  Neurological: Negative for headaches.  All other  systems reviewed and are negative.      Objective:   Physical Exam  Constitutional: She is oriented to person, place, and time. She appears well-developed and well-nourished.  HENT:  Nose: Nose normal.  Mouth/Throat: Oropharynx is clear and moist.  Eyes: EOM are normal.  Neck: Trachea normal, normal range of motion and full passive range of motion without pain. Neck supple. No JVD present. Carotid bruit is not present. No thyromegaly present.  Cardiovascular: Normal rate, regular rhythm, normal heart sounds and intact distal pulses.  Exam reveals no gallop and no friction rub.   No murmur heard. Pulmonary/Chest: Effort normal and breath sounds normal.  Abdominal: Soft. Bowel sounds are normal. She exhibits no distension and no mass. There is no tenderness.  Musculoskeletal: Normal range of motion.  Lymphadenopathy:    She has no cervical adenopathy.  Neurological: She is alert and oriented to person, place, and time. She has normal reflexes.  Skin: Skin is warm and dry.  Psychiatric: She has a normal mood and affect. Her behavior is normal. Judgment and thought content normal.    BP 136/75 mmHg  Pulse 62  Temp(Src) 98.4 F (36.9 C)  Ht 5' 2"  (1.575 m)  Wt 214 lb 3.2 oz (97.16 kg)  BMI 39.17 kg/m2  LMP 06/01/1997        Assessment & Plan:   1. Essential hypertension Do not add salt to diet - losartan (COZAAR) 100 MG tablet; Take 1 tablet (100 mg total) by mouth daily.  Dispense: 90 tablet; Refill: 1 - CMP14+EGFR  2. Postoperative hypothyroidism - levothyroxine (SYNTHROID, LEVOTHROID) 50 MCG tablet; Take 1 tablet (50 mcg total) by mouth daily.  Dispense: 90 tablet; Refill: 1  3. Hyperlipidemia Low fat diet - simvastatin (ZOCOR) 40 MG tablet; Take 1 tablet (40 mg total) by mouth at bedtime.  Dispense: 90 tablet; Refill: 1 -  Lipid panel  4. GAD (generalized anxiety disorder) Stress management  5. Depression - DULoxetine (CYMBALTA) 30 MG capsule; Take 1 capsule (30  mg total) by mouth daily.  Dispense: 90 capsule; Refill: 1  6. BMI 39.0-39.9,adult Discussed diet and exercise for person with BMI >25 Will recheck weight in 3-6 months     Labs pending Health maintenance reviewed Diet and exercise encouraged Continue all meds Follow up  In 3 month   [place], FNP

## 2014-10-11 LAB — CMP14+EGFR
A/G RATIO: 1.7 (ref 1.1–2.5)
ALK PHOS: 72 IU/L (ref 39–117)
ALT: 19 IU/L (ref 0–32)
AST: 17 IU/L (ref 0–40)
Albumin: 4.3 g/dL (ref 3.6–4.8)
BILIRUBIN TOTAL: 0.7 mg/dL (ref 0.0–1.2)
BUN / CREAT RATIO: 19 (ref 11–26)
BUN: 15 mg/dL (ref 8–27)
CO2: 24 mmol/L (ref 18–29)
Calcium: 9.9 mg/dL (ref 8.7–10.3)
Chloride: 101 mmol/L (ref 97–108)
Creatinine, Ser: 0.78 mg/dL (ref 0.57–1.00)
GFR calc Af Amer: 94 mL/min/{1.73_m2} (ref >59)
GFR calc non Af Amer: 82 mL/min/{1.73_m2} (ref >59)
GLUCOSE: 113 mg/dL — AB (ref 65–99)
Globulin, Total: 2.5 g/dL (ref 1.5–4.5)
Potassium: 4.9 mmol/L (ref 3.5–5.2)
SODIUM: 141 mmol/L (ref 134–144)
TOTAL PROTEIN: 6.8 g/dL (ref 6.0–8.5)

## 2014-10-11 LAB — LIPID PANEL
Chol/HDL Ratio: 2.3 ratio units (ref 0.0–4.4)
Cholesterol, Total: 170 mg/dL (ref 100–199)
HDL: 73 mg/dL (ref >39)
LDL CALC: 60 mg/dL (ref 0–99)
TRIGLYCERIDES: 184 mg/dL — AB (ref 0–149)
VLDL CHOLESTEROL CAL: 37 mg/dL (ref 5–40)

## 2014-10-11 LAB — PLEASE NOTE

## 2015-01-12 ENCOUNTER — Ambulatory Visit (INDEPENDENT_AMBULATORY_CARE_PROVIDER_SITE_OTHER): Payer: BLUE CROSS/BLUE SHIELD | Admitting: Nurse Practitioner

## 2015-01-12 ENCOUNTER — Encounter: Payer: Self-pay | Admitting: Nurse Practitioner

## 2015-01-12 VITALS — BP 137/74 | HR 66 | Temp 97.0°F | Ht 62.0 in | Wt 215.0 lb

## 2015-01-12 DIAGNOSIS — Z23 Encounter for immunization: Secondary | ICD-10-CM

## 2015-01-12 DIAGNOSIS — E890 Postprocedural hypothyroidism: Secondary | ICD-10-CM

## 2015-01-12 DIAGNOSIS — I10 Essential (primary) hypertension: Secondary | ICD-10-CM

## 2015-01-12 DIAGNOSIS — F329 Major depressive disorder, single episode, unspecified: Secondary | ICD-10-CM

## 2015-01-12 DIAGNOSIS — E785 Hyperlipidemia, unspecified: Secondary | ICD-10-CM

## 2015-01-12 DIAGNOSIS — Z1159 Encounter for screening for other viral diseases: Secondary | ICD-10-CM | POA: Diagnosis not present

## 2015-01-12 DIAGNOSIS — F32A Depression, unspecified: Secondary | ICD-10-CM

## 2015-01-12 DIAGNOSIS — F411 Generalized anxiety disorder: Secondary | ICD-10-CM

## 2015-01-12 MED ORDER — LOSARTAN POTASSIUM 100 MG PO TABS: 100.0000 mg | ORAL_TABLET | Freq: Every day | ORAL | Status: DC

## 2015-01-12 NOTE — Progress Notes (Signed)
Subjective:    Patient ID: Linda Hernandez, female    DOB: 11-Jan-1951, 64 y.o.   MRN: [id number]  Patient in today for chronic follow up- no changes since last visit.  Hypertension This is a chronic problem. The current episode started more than 1 year ago. The problem is unchanged. The problem is uncontrolled. Pertinent negatives include no chest pain, headaches, neck pain, palpitations or shortness of breath. Risk factors for coronary artery disease include dyslipidemia, obesity and post-menopausal state. Past treatments include angiotensin blockers. The current treatment provides moderate improvement. Compliance problems include diet and exercise.  Hypertensive end-organ damage includes a thyroid problem.  Hyperlipidemia This is a chronic problem. The current episode started more than 1 year ago. The problem is resistant. Recent lipid tests were reviewed and are variable. Pertinent negatives include no chest pain or shortness of breath. Current antihyperlipidemic treatment includes statins. The current treatment provides moderate improvement of lipids. Compliance problems include adherence to diet and adherence to exercise.  Risk factors for coronary artery disease include dyslipidemia, hypertension, obesity and post-menopausal.  Thyroid Problem Visit type: hypothyroidism. Patient reports no diaphoresis, diarrhea, heat intolerance, menstrual problem, palpitations or visual change. Her past medical history is significant for hyperlipidemia.  GAD Cymbalta working well- keeps her from worrying so much.   Review of Systems  Constitutional: Negative for diaphoresis.  Respiratory: Negative for shortness of breath.   Cardiovascular: Negative for chest pain and palpitations.  Gastrointestinal: Negative for diarrhea.  Endocrine: Negative for heat intolerance.  Genitourinary: Negative for menstrual problem.  Musculoskeletal: Negative for neck pain.  Neurological: Negative for headaches.  All other  systems reviewed and are negative.      Objective:   Physical Exam  Constitutional: She is oriented to person, place, and time. She appears well-developed and well-nourished.  HENT:  Nose: Nose normal.  Mouth/Throat: Oropharynx is clear and moist.  Eyes: EOM are normal.  Neck: Trachea normal, normal range of motion and full passive range of motion without pain. Neck supple. No JVD present. Carotid bruit is not present. No thyromegaly present.  Cardiovascular: Normal rate, regular rhythm, normal heart sounds and intact distal pulses.  Exam reveals no gallop and no friction rub.   No murmur heard. Pulmonary/Chest: Effort normal and breath sounds normal.  Abdominal: Soft. Bowel sounds are normal. She exhibits no distension and no mass. There is no tenderness.  Musculoskeletal: Normal range of motion.  Lymphadenopathy:    She has no cervical adenopathy.  Neurological: She is alert and oriented to person, place, and time. She has normal reflexes.  Skin: Skin is warm and dry.  Psychiatric: She has a normal mood and affect. Her behavior is normal. Judgment and thought content normal.    BP 137/74 mmHg  Pulse 66  Temp(Src) 97 F (36.1 C) (Oral)  Ht _0  (1.575 m)  Wt 215 lb (97.523 kg)  BMI 39.31 kg/m2  LMP 06/01/1997        Assessment & Plan:   1. Essential hypertension Do not add salt to diet - losartan (COZAAR) 100 MG tablet; Take 1 tablet (100 mg total) by mouth daily.  Dispense: 90 tablet; Refill: 1 - CMP14+EGFR  2. Postoperative hypothyroidism - Thyroid Panel With TSH  3. Hyperlipidemia Low fat diet - Lipid panel  4. GAD (generalized anxiety disorder) Stress management  5. Depression  6. Morbid obesity, unspecified obesity type ([place]) Discussed diet and exercise for person with BMI >25 Will recheck weight in 3-6 months   7. Need  for hepatitis C screening test - Hepatitis C antibody    Labs pending Health maintenance reviewed Diet and exercise  encouraged Continue all meds Follow up  In 3 months   [place], FNP

## 2015-01-12 NOTE — Patient Instructions (Signed)
Bone Health Bones protect organs, store calcium, and anchor muscles. Good health habits, such as eating nutritious foods and exercising regularly, are important for maintaining healthy bones. They can also help to prevent a condition that causes bones to lose density and become weak and brittle (osteoporosis). WHY IS BONE MASS IMPORTANT? Bone mass refers to the amount of bone tissue that you have. The higher your bone mass, the stronger your bones. An important step toward having healthy bones throughout life is to have strong and dense bones during childhood. A young adult who has a high bone mass is more likely to have a high bone mass later in life. Bone mass at its greatest it is called peak bone mass. A large decline in bone mass occurs in older adults. In women, it occurs about the time of menopause. During this time, it is important to practice good health habits, because if more bone is lost than what is replaced, the bones will become less healthy and more likely to break (fracture). If you find that you have a low bone mass, you may be able to prevent osteoporosis or further bone loss by changing your diet and lifestyle. HOW CAN I FIND OUT IF MY BONE MASS IS LOW? Bone mass can be measured with an X-ray test that is called a bone mineral density (BMD) test. This test is recommended for all women who are age 65 or older. It may also be recommended for men who are age 70 or older, or for people who are more likely to develop osteoporosis due to:  Having bones that break easily.  Having a long-term disease that weakens bones, such as kidney disease or rheumatoid arthritis.  Having menopause earlier than normal.  Taking medicine that weakens bones, such as steroids, thyroid hormones, or hormone treatment for breast cancer or prostate cancer.  Smoking.  Drinking three or more alcoholic drinks each day. WHAT ARE THE NUTRITIONAL RECOMMENDATIONS FOR HEALTHY BONES? To have healthy bones, you need  to get enough of the right minerals and vitamins. Most nutrition experts recommend getting these nutrients from the foods that you eat. Nutritional recommendations vary from person to person. Ask your health care provider what is healthy for you. Here are some general guidelines. Calcium Recommendations Calcium is the most important (essential) mineral for bone health. Most people can get enough calcium from their diet, but supplements may be recommended for people who are at risk for osteoporosis. Good sources of calcium include:  Dairy products, such as low-fat or nonfat milk, cheese, and yogurt.  Dark green leafy vegetables, such as bok choy and broccoli.  Calcium-fortified foods, such as orange juice, cereal, bread, soy beverages, and tofu products.  Nuts, such as almonds. Follow these recommended amounts for daily calcium intake:  Children, age 1-3: 700 mg.  Children, age 4-8: 1,000 mg.  Children, age 9-13: 1,300 mg.  Teens, age 14-18: 1,300 mg.  Adults, age 19-50: 1,000 mg.  Adults, age 51-70:  Men: 1,000 mg.  Women: 1,200 mg.  Adults, age 71 or older: 1,200 mg.  Pregnant and breastfeeding females:  Teens: 1,300 mg.  Adults: 1,000 mg. Vitamin D Recommendations Vitamin D is the most essential vitamin for bone health. It helps the body to absorb calcium. Sunlight stimulates the skin to make vitamin D, so be sure to get enough sunlight. If you live in a cold climate or you do not get outside often, your health care provider may recommend that you take vitamin D supplements. Good   sources of vitamin D in your diet include:  Egg yolks.  Saltwater fish.  Milk and cereal fortified with vitamin D. Follow these recommended amounts for daily vitamin D intake:  Children and teens, age 1-18: 600 international units.  Adults, age 50 or younger: 400-800 international units.  Adults, age 51 or older: 800-1,000 international units. Other Nutrients Other nutrients for bone  health include:  Phosphorus. This mineral is found in meat, poultry, dairy foods, nuts, and legumes. The recommended daily intake for adult men and adult women is 700 mg.  Magnesium. This mineral is found in seeds, nuts, dark green vegetables, and legumes. The recommended daily intake for adult men is 400-420 mg. For adult women, it is 310-320 mg.  Vitamin K. This vitamin is found in green leafy vegetables. The recommended daily intake is 120 mg for adult men and 90 mg for adult women. WHAT TYPE OF PHYSICAL ACTIVITY IS BEST FOR BUILDING AND MAINTAINING HEALTHY BONES? Weight-bearing and strength-building activities are important for building and maintaining peak bone mass. Weight-bearing activities cause muscles and bones to work against gravity. Strength-building activities increases muscle strength that supports bones. Weight-bearing and muscle-building activities include:  Walking and hiking.  Jogging and running.  Dancing.  Gym exercises.  Lifting weights.  Tennis and racquetball.  Climbing stairs.  Aerobics. Adults should get at least 30 minutes of moderate physical activity on most days. Children should get at least 60 minutes of moderate physical activity on most days. Ask your health care provide what type of exercise is best for you. WHERE CAN I FIND MORE INFORMATION? For more information, check out the following websites:  National Osteoporosis Foundation: http://nof.org/learn/basics  National Institutes of Health: http://www.niams.nih.gov/Health_Info/Bone/Bone_Health/bone_health_for_life.asp   This information is not intended to replace advice given to you by your health care provider. Make sure you discuss any questions you have with your health care provider.   Document Released: 05/18/2003 Document Revised: 07/12/2014 Document Reviewed: 03/02/2014 Elsevier Interactive Patient Education 2016 [employer]  

## 2015-01-13 DIAGNOSIS — Z23 Encounter for immunization: Secondary | ICD-10-CM | POA: Diagnosis not present

## 2015-01-13 LAB — CMP14+EGFR
ALBUMIN: 4.2 g/dL (ref 3.6–4.8)
ALT: 19 IU/L (ref 0–32)
AST: 20 IU/L (ref 0–40)
Albumin/Globulin Ratio: 1.6 (ref 1.1–2.5)
Alkaline Phosphatase: 76 IU/L (ref 39–117)
BILIRUBIN TOTAL: 0.8 mg/dL (ref 0.0–1.2)
BUN / CREAT RATIO: 18 (ref 11–26)
BUN: 17 mg/dL (ref 8–27)
CALCIUM: 10.2 mg/dL (ref 8.7–10.3)
CHLORIDE: 99 mmol/L (ref 97–106)
CO2: 27 mmol/L (ref 18–29)
CREATININE: 0.95 mg/dL (ref 0.57–1.00)
GFR calc Af Amer: 74 mL/min/{1.73_m2} (ref >59)
GFR calc non Af Amer: 64 mL/min/{1.73_m2} (ref >59)
GLOBULIN, TOTAL: 2.6 g/dL (ref 1.5–4.5)
Glucose: 116 mg/dL — ABNORMAL HIGH (ref 65–99)
Potassium: 4.8 mmol/L (ref 3.5–5.2)
Sodium: 140 mmol/L (ref 136–144)
Total Protein: 6.8 g/dL (ref 6.0–8.5)

## 2015-01-13 LAB — HEPATITIS C ANTIBODY

## 2015-01-13 LAB — LIPID PANEL
CHOLESTEROL TOTAL: 164 mg/dL (ref 100–199)
Chol/HDL Ratio: 2.1 ratio units (ref 0.0–4.4)
HDL: 77 mg/dL (ref >39)
LDL CALC: 61 mg/dL (ref 0–99)
TRIGLYCERIDES: 132 mg/dL (ref 0–149)
VLDL CHOLESTEROL CAL: 26 mg/dL (ref 5–40)

## 2015-01-13 LAB — THYROID PANEL WITH TSH
Free Thyroxine Index: 3.3 (ref 1.2–4.9)
T3 UPTAKE RATIO: 27 % (ref 24–39)
T4 TOTAL: 12.1 ug/dL — AB (ref 4.5–12.0)
TSH: 0.837 u[IU]/mL (ref 0.450–4.500)

## 2015-04-14 ENCOUNTER — Ambulatory Visit (INDEPENDENT_AMBULATORY_CARE_PROVIDER_SITE_OTHER): Payer: BLUE CROSS/BLUE SHIELD | Admitting: Nurse Practitioner

## 2015-04-14 ENCOUNTER — Encounter: Payer: Self-pay | Admitting: Nurse Practitioner

## 2015-04-14 ENCOUNTER — Ambulatory Visit: Payer: Self-pay | Admitting: Nurse Practitioner

## 2015-04-14 VITALS — BP 126/69 | HR 69 | Temp 97.6°F | Ht 62.0 in | Wt 216.0 lb

## 2015-04-14 DIAGNOSIS — F32A Depression, unspecified: Secondary | ICD-10-CM

## 2015-04-14 DIAGNOSIS — Z1212 Encounter for screening for malignant neoplasm of rectum: Secondary | ICD-10-CM

## 2015-04-14 DIAGNOSIS — E785 Hyperlipidemia, unspecified: Secondary | ICD-10-CM

## 2015-04-14 DIAGNOSIS — F329 Major depressive disorder, single episode, unspecified: Secondary | ICD-10-CM

## 2015-04-14 DIAGNOSIS — E890 Postprocedural hypothyroidism: Secondary | ICD-10-CM | POA: Diagnosis not present

## 2015-04-14 DIAGNOSIS — F411 Generalized anxiety disorder: Secondary | ICD-10-CM

## 2015-04-14 DIAGNOSIS — I10 Essential (primary) hypertension: Secondary | ICD-10-CM

## 2015-04-14 MED ORDER — LOSARTAN POTASSIUM 100 MG PO TABS: 100.0000 mg | ORAL_TABLET | Freq: Every day | ORAL | Status: DC

## 2015-04-14 MED ORDER — LEVOTHYROXINE SODIUM 50 MCG PO TABS: 50.0000 ug | ORAL_TABLET | Freq: Every day | ORAL | Status: DC

## 2015-04-14 MED ORDER — DULOXETINE HCL 30 MG PO CPEP: 30.0000 mg | ORAL_CAPSULE | Freq: Every day | ORAL | Status: DC

## 2015-04-14 MED ORDER — SIMVASTATIN 40 MG PO TABS: 40.0000 mg | ORAL_TABLET | Freq: Every day | ORAL | Status: DC

## 2015-04-14 NOTE — Progress Notes (Signed)
Subjective:    Patient ID: Linda Hernandez, female    DOB: Feb 01, 1951, 65 y.o.   MRN: [id number]  Patient here today for follow up of chronic medical problems.  Outpatient Encounter Prescriptions as of 04/14/2015  Medication Sig  . Ascorbic Acid (VITAMIN C) 100 MG tablet Take 100 mg by mouth daily.  . Calcium Carbonate (CALTRATE 600 PO) Take by mouth.  . cholecalciferol (VITAMIN D) 1000 UNITS tablet Take 1,000 Units by mouth daily.  . DULoxetine (CYMBALTA) 30 MG capsule Take 1 capsule (30 mg total) by mouth daily.  . fish oil-omega-3 fatty acids 1000 MG capsule Take 2 g by mouth daily.  Marland Kitchen levothyroxine (SYNTHROID, LEVOTHROID) 50 MCG tablet Take 1 tablet (50 mcg total) by mouth daily.  Marland Kitchen losartan (COZAAR) 100 MG tablet Take 1 tablet (100 mg total) by mouth daily.  . simvastatin (ZOCOR) 40 MG tablet Take 1 tablet (40 mg total) by mouth at bedtime.   No facility-administered encounter medications on file as of 04/14/2015.     Hypertension This is a chronic problem. The current episode started more than 1 year ago. The problem is unchanged. The problem is uncontrolled. Pertinent negatives include no chest pain, headaches, neck pain, palpitations or shortness of breath. Risk factors for coronary artery disease include dyslipidemia, obesity and post-menopausal state. Past treatments include angiotensin blockers. The current treatment provides moderate improvement. Compliance problems include diet and exercise.  Hypertensive end-organ damage includes a thyroid problem.  Hyperlipidemia This is a chronic problem. The current episode started more than 1 year ago. The problem is resistant. Recent lipid tests were reviewed and are variable. Pertinent negatives include no chest pain or shortness of breath. Current antihyperlipidemic treatment includes statins. The current treatment provides moderate improvement of lipids. Compliance problems include adherence to diet and adherence to exercise.  Risk factors for  coronary artery disease include dyslipidemia, hypertension, obesity and post-menopausal.  Thyroid Problem Visit type: hypothyroidism. Patient reports no diaphoresis, diarrhea, heat intolerance, menstrual problem, palpitations or visual change. Her past medical history is significant for hyperlipidemia.  GAD Cymbalta working well- keeps her from worrying so much.   Review of Systems  Constitutional: Negative for diaphoresis.  Respiratory: Negative for shortness of breath.   Cardiovascular: Negative for chest pain and palpitations.  Gastrointestinal: Negative for diarrhea.  Endocrine: Negative for heat intolerance.  Genitourinary: Negative for menstrual problem.  Musculoskeletal: Negative for neck pain.  Neurological: Negative for headaches.  All other systems reviewed and are negative.      Objective:   Physical Exam  Constitutional: She is oriented to person, place, and time. She appears well-developed and well-nourished.  HENT:  Nose: Nose normal.  Mouth/Throat: Oropharynx is clear and moist.  Eyes: EOM are normal.  Neck: Trachea normal, normal range of motion and full passive range of motion without pain. Neck supple. No JVD present. Carotid bruit is not present. No thyromegaly present.  Cardiovascular: Normal rate, regular rhythm, normal heart sounds and intact distal pulses.  Exam reveals no gallop and no friction rub.   No murmur heard. Pulmonary/Chest: Effort normal and breath sounds normal.  Abdominal: Soft. Bowel sounds are normal. She exhibits no distension and no mass. There is no tenderness.  Musculoskeletal: Normal range of motion.  Lymphadenopathy:    She has no cervical adenopathy.  Neurological: She is alert and oriented to person, place, and time. She has normal reflexes.  Skin: Skin is warm and dry.  Psychiatric: She has a normal mood and affect. Her behavior  is normal. Judgment and thought content normal.    BP 126/69 mmHg  Pulse 69  Temp(Src) 97.6 F (36.4  C) (Oral)  Ht _0  (1.575 m)  Wt 216 lb (97.977 kg)  BMI 39.50 kg/m2  LMP 06/01/1997        Assessment & Plan:   1. Essential hypertension Do not add salt to diet - losartan (COZAAR) 100 MG tablet; Take 1 tablet (100 mg total) by mouth daily.  Dispense: 90 tablet; Refill: 1 - CMP14+EGFR  2. Postoperative hypothyroidism - levothyroxine (SYNTHROID, LEVOTHROID) 50 MCG tablet; Take 1 tablet (50 mcg total) by mouth daily.  Dispense: 90 tablet; Refill: 1  3. Hyperlipidemia Low fta diet - simvastatin (ZOCOR) 40 MG tablet; Take 1 tablet (40 mg total) by mouth at bedtime.  Dispense: 90 tablet; Refill: 1 - Lipid panel  4. GAD (generalized anxiety disorder) Stress management  5. Depression Stress managemnet - DULoxetine (CYMBALTA) 30 MG capsule; Take 1 capsule (30 mg total) by mouth daily.  Dispense: 90 capsule; Refill: 1  6. Morbid obesity, unspecified obesity type ([place]) Discussed diet and exercise for person with BMI >25 Will recheck weight in 3-6 months   7. Screening for malignant neoplasm of the rectum - Fecal occult blood, imunochemical; Future    Labs pending Health maintenance reviewed Diet and exercise encouraged Continue all meds Follow up  In 6 month   [place], FNP

## 2015-04-14 NOTE — Patient Instructions (Signed)
Health Maintenance, Female Adopting a healthy lifestyle and getting preventive care can go a long way to promote health and wellness. Talk with your health care provider about what schedule of regular examinations is right for you. This is a good chance for you to check in with your provider about disease prevention and staying healthy. In between checkups, there are plenty of things you can do on your own. Experts have done a lot of research about which lifestyle changes and preventive measures are most likely to keep you healthy. Ask your health care provider for more information. WEIGHT AND DIET  Eat a healthy diet  Be sure to include plenty of vegetables, fruits, low-fat dairy products, and lean protein.  Do not eat a lot of foods high in solid fats, added sugars, or salt.  Get regular exercise. This is one of the most important things you can do for your health.  Most adults should exercise for at least 150 minutes each week. The exercise should increase your heart rate and make you sweat (moderate-intensity exercise).  Most adults should also do strengthening exercises at least twice a week. This is in addition to the moderate-intensity exercise.  Maintain a healthy weight  Body mass index (BMI) is a measurement that can be used to identify possible weight problems. It estimates body fat based on height and weight. Your health care provider can help determine your BMI and help you achieve or maintain a healthy weight.  For females 20 years of age and older:   A BMI below 18.5 is considered underweight.  A BMI of 18.5 to 24.9 is normal.  A BMI of 25 to 29.9 is considered overweight.  A BMI of 30 and above is considered obese.  Watch levels of cholesterol and blood lipids  You should start having your blood tested for lipids and cholesterol at 63 years of age, then have this test every 5 years.  You may need to have your cholesterol levels checked more often if:  Your lipid  or cholesterol levels are high.  You are older than 63 years of age.  You are at high risk for heart disease.  CANCER SCREENING   Lung Cancer  Lung cancer screening is recommended for adults 55-80 years old who are at high risk for lung cancer because of a history of smoking.  A yearly low-dose CT scan of the lungs is recommended for people who:  Currently smoke.  Have quit within the past 15 years.  Have at least a 30-pack-year history of smoking. A pack year is smoking an average of one pack of cigarettes a day for 1 year.  Yearly screening should continue until it has been 15 years since you quit.  Yearly screening should stop if you develop a health problem that would prevent you from having lung cancer treatment.  Breast Cancer  Practice breast self-awareness. This means understanding how your breasts normally appear and feel.  It also means doing regular breast self-exams. Let your health care provider know about any changes, no matter how small.  If you are in your 20s or 30s, you should have a clinical breast exam (CBE) by a health care provider every 1-3 years as part of a regular health exam.  If you are 40 or older, have a CBE every year. Also consider having a breast X-ray (mammogram) every year.  If you have a family history of breast cancer, talk to your health care provider about genetic screening.  If you   are at high risk for breast cancer, talk to your health care provider about having an MRI and a mammogram every year.  Breast cancer gene (BRCA) assessment is recommended for women who have family members with BRCA-related cancers. BRCA-related cancers include:  Breast.  Ovarian.  Tubal.  Peritoneal cancers.  Results of the assessment will determine the need for genetic counseling and BRCA1 and BRCA2 testing. Cervical Cancer Your health care provider may recommend that you be screened regularly for cancer of the pelvic organs (ovaries, uterus, and  vagina). This screening involves a pelvic examination, including checking for microscopic changes to the surface of your cervix (Pap test). You may be encouraged to have this screening done every 3 years, beginning at age 21.  For women ages 30-65, health care providers may recommend pelvic exams and Pap testing every 3 years, or they may recommend the Pap and pelvic exam, combined with testing for human papilloma virus (HPV), every 5 years. Some types of HPV increase your risk of cervical cancer. Testing for HPV may also be done on women of any age with unclear Pap test results.  Other health care providers may not recommend any screening for nonpregnant women who are considered low risk for pelvic cancer and who do not have symptoms. Ask your health care provider if a screening pelvic exam is right for you.  If you have had past treatment for cervical cancer or a condition that could lead to cancer, you need Pap tests and screening for cancer for at least 20 years after your treatment. If Pap tests have been discontinued, your risk factors (such as having a new sexual partner) need to be reassessed to determine if screening should resume. Some women have medical problems that increase the chance of getting cervical cancer. In these cases, your health care provider may recommend more frequent screening and Pap tests. Colorectal Cancer  This type of cancer can be detected and often prevented.  Routine colorectal cancer screening usually begins at 63 years of age and continues through 63 years of age.  Your health care provider may recommend screening at an earlier age if you have risk factors for colon cancer.  Your health care provider may also recommend using home test kits to check for hidden blood in the stool.  A small camera at the end of a tube can be used to examine your colon directly (sigmoidoscopy or colonoscopy). This is done to check for the earliest forms of colorectal  cancer.  Routine screening usually begins at age 50.  Direct examination of the colon should be repeated every 5-10 years through 63 years of age. However, you may need to be screened more often if early forms of precancerous polyps or small growths are found. Skin Cancer  Check your skin from head to toe regularly.  Tell your health care provider about any new moles or changes in moles, especially if there is a change in a mole's shape or color.  Also tell your health care provider if you have a mole that is larger than the size of a pencil eraser.  Always use sunscreen. Apply sunscreen liberally and repeatedly throughout the day.  Protect yourself by wearing long sleeves, pants, a wide-brimmed hat, and sunglasses whenever you are outside. HEART DISEASE, DIABETES, AND HIGH BLOOD PRESSURE   High blood pressure causes heart disease and increases the risk of stroke. High blood pressure is more likely to develop in:  People who have blood pressure in the high end   of the normal range (130-139/85-89 mm Hg).  People who are overweight or obese.  People who are African American.  If you are 67-24 years of age, have your blood pressure checked every 3-5 years. If you are 38 years of age or older, have your blood pressure checked every year. You should have your blood pressure measured twice--once when you are at a hospital or clinic, and once when you are not at a hospital or clinic. Record the average of the two measurements. To check your blood pressure when you are not at a hospital or clinic, you can use:  An automated blood pressure machine at a pharmacy.  A home blood pressure monitor.  If you are between 38 years and 77 years old, ask your health care provider if you should take aspirin to prevent strokes.  Have regular diabetes screenings. This involves taking a blood sample to check your fasting blood sugar level.  If you are at a normal weight and have a low risk for diabetes,  have this test once every three years after 63 years of age.  If you are overweight and have a high risk for diabetes, consider being tested at a younger age or more often. PREVENTING INFECTION  Hepatitis B  If you have a higher risk for hepatitis B, you should be screened for this virus. You are considered at high risk for hepatitis B if:  You were born in a country where hepatitis B is common. Ask your health care provider which countries are considered high risk.  Your parents were born in a high-risk country, and you have not been immunized against hepatitis B (hepatitis B vaccine).  You have HIV or AIDS.  You use needles to inject street drugs.  You live with someone who has hepatitis B.  You have had sex with someone who has hepatitis B.  You get hemodialysis treatment.  You take certain medicines for conditions, including cancer, organ transplantation, and autoimmune conditions. Hepatitis C  Blood testing is recommended for:  Everyone born from [birth year] through 1963.  Anyone with known risk factors for hepatitis C. Sexually transmitted infections (STIs)  You should be screened for sexually transmitted infections (STIs) including gonorrhea and chlamydia if:  You are sexually active and are younger than 63 years of age.  You are older than 63 years of age and your health care provider tells you that you are at risk for this type of infection.  Your sexual activity has changed since you were last screened and you are at an increased risk for chlamydia or gonorrhea. Ask your health care provider if you are at risk.  If you do not have HIV, but are at risk, it may be recommended that you take a prescription medicine daily to prevent HIV infection. This is called pre-exposure prophylaxis (PrEP). You are considered at risk if:  You are sexually active and do not regularly use condoms or know the HIV status of your partner(s).  You take drugs by injection.  You are sexually  active with a partner who has HIV. Talk with your health care provider about whether you are at high risk of being infected with HIV. If you choose to begin PrEP, you should first be tested for HIV. You should then be tested every 3 months for as long as you are taking PrEP.  PREGNANCY   If you are premenopausal and you may become pregnant, ask your health care provider about preconception counseling.  If you may  become pregnant, take 400 to 800 micrograms (mcg) of folic acid every day.  If you want to prevent pregnancy, talk to your health care provider about birth control (contraception). OSTEOPOROSIS AND MENOPAUSE   Osteoporosis is a disease in which the bones lose minerals and strength with aging. This can result in serious bone fractures. Your risk for osteoporosis can be identified using a bone density scan.  If you are 61 years of age or older, or if you are at risk for osteoporosis and fractures, ask your health care provider if you should be screened.  Ask your health care provider whether you should take a calcium or vitamin D supplement to lower your risk for osteoporosis.  Menopause may have certain physical symptoms and risks.  Hormone replacement therapy may reduce some of these symptoms and risks. Talk to your health care provider about whether hormone replacement therapy is right for you.  HOME CARE INSTRUCTIONS   Schedule regular health, dental, and eye exams.  Stay current with your immunizations.   Do not use any tobacco products including cigarettes, chewing tobacco, or electronic cigarettes.  If you are pregnant, do not drink alcohol.  If you are breastfeeding, limit how much and how often you drink alcohol.  Limit alcohol intake to no more than 1 drink per day for nonpregnant women. One drink equals 12 ounces of beer, 5 ounces of wine, or 1 ounces of hard liquor.  Do not use street drugs.  Do not share needles.  Ask your health care provider for help if  you need support or information about quitting drugs.  Tell your health care provider if you often feel depressed.  Tell your health care provider if you have ever been abused or do not feel safe at home.   This information is not intended to replace advice given to you by your health care provider. Make sure you discuss any questions you have with your health care provider.   Document Released: 09/10/2010 Document Revised: 03/18/2014 Document Reviewed: 01/27/2013 Elsevier Interactive Patient Education [employer].

## 2015-04-15 LAB — LIPID PANEL
CHOLESTEROL TOTAL: 152 mg/dL (ref 100–199)
Chol/HDL Ratio: 2.4 ratio units (ref 0.0–4.4)
HDL: 64 mg/dL (ref >39)
LDL Calculated: 40 mg/dL (ref 0–99)
TRIGLYCERIDES: 238 mg/dL — AB (ref 0–149)
VLDL Cholesterol Cal: 48 mg/dL — ABNORMAL HIGH (ref 5–40)

## 2015-04-15 LAB — CMP14+EGFR
A/G RATIO: 1.8 (ref 1.1–2.5)
ALK PHOS: 82 IU/L (ref 39–117)
ALT: 16 IU/L (ref 0–32)
AST: 17 IU/L (ref 0–40)
Albumin: 4.2 g/dL (ref 3.6–4.8)
BUN/Creatinine Ratio: 18 (ref 11–26)
BUN: 16 mg/dL (ref 8–27)
Bilirubin Total: 0.5 mg/dL (ref 0.0–1.2)
CO2: 26 mmol/L (ref 18–29)
Calcium: 10.3 mg/dL (ref 8.7–10.3)
Chloride: 99 mmol/L (ref 96–106)
Creatinine, Ser: 0.88 mg/dL (ref 0.57–1.00)
GFR calc Af Amer: 81 mL/min/{1.73_m2} (ref >59)
GFR calc non Af Amer: 70 mL/min/{1.73_m2} (ref >59)
GLOBULIN, TOTAL: 2.4 g/dL (ref 1.5–4.5)
Glucose: 117 mg/dL — ABNORMAL HIGH (ref 65–99)
POTASSIUM: 4.5 mmol/L (ref 3.5–5.2)
SODIUM: 141 mmol/L (ref 134–144)
Total Protein: 6.6 g/dL (ref 6.0–8.5)

## 2015-04-17 ENCOUNTER — Ambulatory Visit: Payer: BLUE CROSS/BLUE SHIELD | Admitting: Nurse Practitioner

## 2015-04-21 ENCOUNTER — Other Ambulatory Visit: Payer: BLUE CROSS/BLUE SHIELD

## 2015-04-21 DIAGNOSIS — Z1212 Encounter for screening for malignant neoplasm of rectum: Secondary | ICD-10-CM

## 2015-04-22 LAB — FECAL OCCULT BLOOD, IMMUNOCHEMICAL: Fecal Occult Bld: NEGATIVE

## 2015-10-12 ENCOUNTER — Ambulatory Visit: Payer: BLUE CROSS/BLUE SHIELD | Admitting: Nurse Practitioner

## 2015-10-24 ENCOUNTER — Ambulatory Visit (INDEPENDENT_AMBULATORY_CARE_PROVIDER_SITE_OTHER): Payer: BLUE CROSS/BLUE SHIELD | Admitting: Nurse Practitioner

## 2015-10-24 ENCOUNTER — Encounter: Payer: Self-pay | Admitting: Nurse Practitioner

## 2015-10-24 VITALS — BP 136/74 | HR 60 | Temp 97.4°F | Ht 62.0 in | Wt 213.0 lb

## 2015-10-24 DIAGNOSIS — F411 Generalized anxiety disorder: Secondary | ICD-10-CM | POA: Diagnosis not present

## 2015-10-24 DIAGNOSIS — Z1231 Encounter for screening mammogram for malignant neoplasm of breast: Secondary | ICD-10-CM

## 2015-10-24 DIAGNOSIS — E890 Postprocedural hypothyroidism: Secondary | ICD-10-CM

## 2015-10-24 DIAGNOSIS — I10 Essential (primary) hypertension: Secondary | ICD-10-CM | POA: Diagnosis not present

## 2015-10-24 DIAGNOSIS — F32A Depression, unspecified: Secondary | ICD-10-CM

## 2015-10-24 DIAGNOSIS — E785 Hyperlipidemia, unspecified: Secondary | ICD-10-CM

## 2015-10-24 DIAGNOSIS — F329 Major depressive disorder, single episode, unspecified: Secondary | ICD-10-CM | POA: Diagnosis not present

## 2015-10-24 MED ORDER — LEVOTHYROXINE SODIUM 50 MCG PO TABS: 50.0000 ug | ORAL_TABLET | Freq: Every day | ORAL | 1 refills | Status: DC

## 2015-10-24 MED ORDER — LOSARTAN POTASSIUM 100 MG PO TABS: 100.0000 mg | ORAL_TABLET | Freq: Every day | ORAL | 1 refills | Status: DC

## 2015-10-24 MED ORDER — SIMVASTATIN 40 MG PO TABS: 40.0000 mg | ORAL_TABLET | Freq: Every day | ORAL | 1 refills | Status: DC

## 2015-10-24 MED ORDER — DULOXETINE HCL 30 MG PO CPEP: 30.0000 mg | ORAL_CAPSULE | Freq: Every day | ORAL | 1 refills | Status: DC

## 2015-10-24 NOTE — Progress Notes (Signed)
Subjective:    Patient ID: Linda Hernandez, female    DOB: 16-May-1950, 66 y.o.   MRN: [id number]  Patient here today for follow up of chronic medical problems. No changes since last visit. She has no complaints to day.  Outpatient Encounter Prescriptions as of 10/24/2015  Medication Sig  . Ascorbic Acid (VITAMIN C) 100 MG tablet Take 100 mg by mouth daily.  . Calcium Carbonate (CALTRATE 600 PO) Take by mouth.  . cholecalciferol (VITAMIN D) 1000 UNITS tablet Take 1,000 Units by mouth daily.  . DULoxetine (CYMBALTA) 30 MG capsule Take 1 capsule (30 mg total) by mouth daily.  . fish oil-omega-3 fatty acids 1000 MG capsule Take 2 g by mouth daily.  Marland Kitchen levothyroxine (SYNTHROID, LEVOTHROID) 50 MCG tablet Take 1 tablet (50 mcg total) by mouth daily.  Marland Kitchen losartan (COZAAR) 100 MG tablet Take 1 tablet (100 mg total) by mouth daily.  . simvastatin (ZOCOR) 40 MG tablet Take 1 tablet (40 mg total) by mouth at bedtime.   No facility-administered encounter medications on file as of 10/24/2015.      Hypertension  This is a chronic problem. The current episode started more than 1 year ago. The problem is unchanged. The problem is uncontrolled. Pertinent negatives include no palpitations or shortness of breath. Risk factors for coronary artery disease include dyslipidemia, obesity and post-menopausal state. Past treatments include angiotensin blockers. The current treatment provides moderate improvement. Compliance problems include diet and exercise.  Hypertensive end-organ damage includes a thyroid problem.  Hyperlipidemia  This is a chronic problem. The current episode started more than 1 year ago. The problem is resistant. Recent lipid tests were reviewed and are variable. Pertinent negatives include no shortness of breath. Current antihyperlipidemic treatment includes statins. The current treatment provides moderate improvement of lipids. Compliance problems include adherence to diet and adherence to exercise.   Risk factors for coronary artery disease include dyslipidemia, hypertension, obesity and post-menopausal.  Thyroid Problem  Visit type: hypothyroidism. Patient reports no diarrhea, heat intolerance, menstrual problem or palpitations. Her past medical history is significant for hyperlipidemia.  GAD Cymbalta working well- keeps her from worrying so much.   Review of Systems  Respiratory: Negative for shortness of breath.   Cardiovascular: Negative for palpitations.  Gastrointestinal: Negative for diarrhea.  Endocrine: Negative for heat intolerance.  Genitourinary: Negative for menstrual problem.  All other systems reviewed and are negative.      Objective:   Physical Exam  Constitutional: She is oriented to person, place, and time. She appears well-developed and well-nourished.  HENT:  Nose: Nose normal.  Mouth/Throat: Oropharynx is clear and moist.  Eyes: EOM are normal.  Neck: Trachea normal, normal range of motion and full passive range of motion without pain. Neck supple. No JVD present. Carotid bruit is not present. No thyromegaly present.  Cardiovascular: Normal rate, regular rhythm, normal heart sounds and intact distal pulses.  Exam reveals no gallop and no friction rub.   No murmur heard. Pulmonary/Chest: Effort normal and breath sounds normal.  Abdominal: Soft. Bowel sounds are normal. She exhibits no distension and no mass. There is no tenderness.  Musculoskeletal: Normal range of motion.  Lymphadenopathy:    She has no cervical adenopathy.  Neurological: She is alert and oriented to person, place, and time. She has normal reflexes.  Skin: Skin is warm and dry.  Psychiatric: She has a normal mood and affect. Her behavior is normal. Judgment and thought content normal.    BP 136/74 (BP Location: Left  Arm, Patient Position: Sitting, Cuff Size: Large)   Pulse 60   Temp 97.4 F (36.3 C) (Oral)   Ht _0  (1.575 m)   Wt 213 lb (96.6 kg)   LMP 06/01/1997   BMI 38.96  kg/m         Assessment & Plan:  1. Essential hypertension Do not add salt  to diet - losartan (COZAAR) 100 MG tablet; Take 1 tablet (100 mg total) by mouth daily.  Dispense: 90 tablet; Refill: 1  2. Postoperative hypothyroidism - levothyroxine (SYNTHROID, LEVOTHROID) 50 MCG tablet; Take 1 tablet (50 mcg total) by mouth daily.  Dispense: 90 tablet; Refill: 1  3. Depression Stress management - DULoxetine (CYMBALTA) 30 MG capsule; Take 1 capsule (30 mg total) by mouth daily.  Dispense: 90 capsule; Refill: 1  4. GAD (generalized anxiety disorder)  5. Hyperlipidemia Low fat diet - simvastatin (ZOCOR) 40 MG tablet; Take 1 tablet (40 mg total) by mouth at bedtime.  Dispense: 90 tablet; Refill: 1  6. Morbid obesity, unspecified obesity type ([place]) Discussed diet and exercise for person with BMI >25 Will recheck weight in 3-6 months  7. Visit for screening mammogram - MM Digital Screening; Future   Orders Placed This Encounter  Procedures  . MM Digital Screening    Standing Status:   Future    Standing Expiration Date:   12/23/2016    Order Specific Question:   Reason for Exam (SYMPTOM  OR DIAGNOSIS REQUIRED)    Answer:   bil screening    Order Specific Question:   Preferred imaging location?    Answer:   External    Comments:   truck at [facility]  . CMP14+EGFR  . Lipid panel  . Thyroid Panel With TSH    Labs pending Health maintenance reviewed Diet and exercise encouraged Continue all meds Follow up  In 6 months   [place], FNP

## 2015-10-24 NOTE — Patient Instructions (Signed)
Health Maintenance, Female Adopting a healthy lifestyle and getting preventive care can go a long way to promote health and wellness. Talk with your health care provider about what schedule of regular examinations is right for you. This is a good chance for you to check in with your provider about disease prevention and staying healthy. In between checkups, there are plenty of things you can do on your own. Experts have done a lot of research about which lifestyle changes and preventive measures are most likely to keep you healthy. Ask your health care provider for more information. WEIGHT AND DIET  Eat a healthy diet  Be sure to include plenty of vegetables, fruits, low-fat dairy products, and lean protein.  Do not eat a lot of foods high in solid fats, added sugars, or salt.  Get regular exercise. This is one of the most important things you can do for your health.  Most adults should exercise for at least 150 minutes each week. The exercise should increase your heart rate and make you sweat (moderate-intensity exercise).  Most adults should also do strengthening exercises at least twice a week. This is in addition to the moderate-intensity exercise.  Maintain a healthy weight  Body mass index (BMI) is a measurement that can be used to identify possible weight problems. It estimates body fat based on height and weight. Your health care provider can help determine your BMI and help you achieve or maintain a healthy weight.  For females 20 years of age and older:   A BMI below 18.5 is considered underweight.  A BMI of 18.5 to 24.9 is normal.  A BMI of 25 to 29.9 is considered overweight.  A BMI of 30 and above is considered obese.  Watch levels of cholesterol and blood lipids  You should start having your blood tested for lipids and cholesterol at 63 years of age, then have this test every 5 years.  You may need to have your cholesterol levels checked more often if:  Your lipid  or cholesterol levels are high.  You are older than 63 years of age.  You are at high risk for heart disease.  CANCER SCREENING   Lung Cancer  Lung cancer screening is recommended for adults 55-80 years old who are at high risk for lung cancer because of a history of smoking.  A yearly low-dose CT scan of the lungs is recommended for people who:  Currently smoke.  Have quit within the past 15 years.  Have at least a 30-pack-year history of smoking. A pack year is smoking an average of one pack of cigarettes a day for 1 year.  Yearly screening should continue until it has been 15 years since you quit.  Yearly screening should stop if you develop a health problem that would prevent you from having lung cancer treatment.  Breast Cancer  Practice breast self-awareness. This means understanding how your breasts normally appear and feel.  It also means doing regular breast self-exams. Let your health care provider know about any changes, no matter how small.  If you are in your 20s or 30s, you should have a clinical breast exam (CBE) by a health care provider every 1-3 years as part of a regular health exam.  If you are 40 or older, have a CBE every year. Also consider having a breast X-ray (mammogram) every year.  If you have a family history of breast cancer, talk to your health care provider about genetic screening.  If you   are at high risk for breast cancer, talk to your health care provider about having an MRI and a mammogram every year.  Breast cancer gene (BRCA) assessment is recommended for women who have family members with BRCA-related cancers. BRCA-related cancers include:  Breast.  Ovarian.  Tubal.  Peritoneal cancers.  Results of the assessment will determine the need for genetic counseling and BRCA1 and BRCA2 testing. Cervical Cancer Your health care provider may recommend that you be screened regularly for cancer of the pelvic organs (ovaries, uterus, and  vagina). This screening involves a pelvic examination, including checking for microscopic changes to the surface of your cervix (Pap test). You may be encouraged to have this screening done every 3 years, beginning at age 21.  For women ages 30-65, health care providers may recommend pelvic exams and Pap testing every 3 years, or they may recommend the Pap and pelvic exam, combined with testing for human papilloma virus (HPV), every 5 years. Some types of HPV increase your risk of cervical cancer. Testing for HPV may also be done on women of any age with unclear Pap test results.  Other health care providers may not recommend any screening for nonpregnant women who are considered low risk for pelvic cancer and who do not have symptoms. Ask your health care provider if a screening pelvic exam is right for you.  If you have had past treatment for cervical cancer or a condition that could lead to cancer, you need Pap tests and screening for cancer for at least 20 years after your treatment. If Pap tests have been discontinued, your risk factors (such as having a new sexual partner) need to be reassessed to determine if screening should resume. Some women have medical problems that increase the chance of getting cervical cancer. In these cases, your health care provider may recommend more frequent screening and Pap tests. Colorectal Cancer  This type of cancer can be detected and often prevented.  Routine colorectal cancer screening usually begins at 63 years of age and continues through 63 years of age.  Your health care provider may recommend screening at an earlier age if you have risk factors for colon cancer.  Your health care provider may also recommend using home test kits to check for hidden blood in the stool.  A small camera at the end of a tube can be used to examine your colon directly (sigmoidoscopy or colonoscopy). This is done to check for the earliest forms of colorectal  cancer.  Routine screening usually begins at age 50.  Direct examination of the colon should be repeated every 5-10 years through 63 years of age. However, you may need to be screened more often if early forms of precancerous polyps or small growths are found. Skin Cancer  Check your skin from head to toe regularly.  Tell your health care provider about any new moles or changes in moles, especially if there is a change in a mole's shape or color.  Also tell your health care provider if you have a mole that is larger than the size of a pencil eraser.  Always use sunscreen. Apply sunscreen liberally and repeatedly throughout the day.  Protect yourself by wearing long sleeves, pants, a wide-brimmed hat, and sunglasses whenever you are outside. HEART DISEASE, DIABETES, AND HIGH BLOOD PRESSURE   High blood pressure causes heart disease and increases the risk of stroke. High blood pressure is more likely to develop in:  People who have blood pressure in the high end   of the normal range (130-139/85-89 mm Hg).  People who are overweight or obese.  People who are African American.  If you are 38-23 years of age, have your blood pressure checked every 3-5 years. If you are 61 years of age or older, have your blood pressure checked every year. You should have your blood pressure measured twice--once when you are at a hospital or clinic, and once when you are not at a hospital or clinic. Record the average of the two measurements. To check your blood pressure when you are not at a hospital or clinic, you can use:  An automated blood pressure machine at a pharmacy.  A home blood pressure monitor.  If you are between 45 years and 39 years old, ask your health care provider if you should take aspirin to prevent strokes.  Have regular diabetes screenings. This involves taking a blood sample to check your fasting blood sugar level.  If you are at a normal weight and have a low risk for diabetes,  have this test once every three years after 63 years of age.  If you are overweight and have a high risk for diabetes, consider being tested at a younger age or more often. PREVENTING INFECTION  Hepatitis B  If you have a higher risk for hepatitis B, you should be screened for this virus. You are considered at high risk for hepatitis B if:  You were born in a country where hepatitis B is common. Ask your health care provider which countries are considered high risk.  Your parents were born in a high-risk country, and you have not been immunized against hepatitis B (hepatitis B vaccine).  You have HIV or AIDS.  You use needles to inject street drugs.  You live with someone who has hepatitis B.  You have had sex with someone who has hepatitis B.  You get hemodialysis treatment.  You take certain medicines for conditions, including cancer, organ transplantation, and autoimmune conditions. Hepatitis C  Blood testing is recommended for:  Everyone born from [birth year] through 1963.  Anyone with known risk factors for hepatitis C. Sexually transmitted infections (STIs)  You should be screened for sexually transmitted infections (STIs) including gonorrhea and chlamydia if:  You are sexually active and are younger than 63 years of age.  You are older than 63 years of age and your health care provider tells you that you are at risk for this type of infection.  Your sexual activity has changed since you were last screened and you are at an increased risk for chlamydia or gonorrhea. Ask your health care provider if you are at risk.  If you do not have HIV, but are at risk, it may be recommended that you take a prescription medicine daily to prevent HIV infection. This is called pre-exposure prophylaxis (PrEP). You are considered at risk if:  You are sexually active and do not regularly use condoms or know the HIV status of your partner(s).  You take drugs by injection.  You are sexually  active with a partner who has HIV. Talk with your health care provider about whether you are at high risk of being infected with HIV. If you choose to begin PrEP, you should first be tested for HIV. You should then be tested every 3 months for as long as you are taking PrEP.  PREGNANCY   If you are premenopausal and you may become pregnant, ask your health care provider about preconception counseling.  If you may  become pregnant, take 400 to 800 micrograms (mcg) of folic acid every day.  If you want to prevent pregnancy, talk to your health care provider about birth control (contraception). OSTEOPOROSIS AND MENOPAUSE   Osteoporosis is a disease in which the bones lose minerals and strength with aging. This can result in serious bone fractures. Your risk for osteoporosis can be identified using a bone density scan.  If you are 61 years of age or older, or if you are at risk for osteoporosis and fractures, ask your health care provider if you should be screened.  Ask your health care provider whether you should take a calcium or vitamin D supplement to lower your risk for osteoporosis.  Menopause may have certain physical symptoms and risks.  Hormone replacement therapy may reduce some of these symptoms and risks. Talk to your health care provider about whether hormone replacement therapy is right for you.  HOME CARE INSTRUCTIONS   Schedule regular health, dental, and eye exams.  Stay current with your immunizations.   Do not use any tobacco products including cigarettes, chewing tobacco, or electronic cigarettes.  If you are pregnant, do not drink alcohol.  If you are breastfeeding, limit how much and how often you drink alcohol.  Limit alcohol intake to no more than 1 drink per day for nonpregnant women. One drink equals 12 ounces of beer, 5 ounces of wine, or 1 ounces of hard liquor.  Do not use street drugs.  Do not share needles.  Ask your health care provider for help if  you need support or information about quitting drugs.  Tell your health care provider if you often feel depressed.  Tell your health care provider if you have ever been abused or do not feel safe at home.   This information is not intended to replace advice given to you by your health care provider. Make sure you discuss any questions you have with your health care provider.   Document Released: 09/10/2010 Document Revised: 03/18/2014 Document Reviewed: 01/27/2013 Elsevier Interactive Patient Education [employer].

## 2015-10-25 LAB — LIPID PANEL
CHOLESTEROL TOTAL: 162 mg/dL (ref 100–199)
Chol/HDL Ratio: 2.3 ratio units (ref 0.0–4.4)
HDL: 70 mg/dL (ref >39)
LDL CALC: 61 mg/dL (ref 0–99)
TRIGLYCERIDES: 156 mg/dL — AB (ref 0–149)
VLDL Cholesterol Cal: 31 mg/dL (ref 5–40)

## 2015-10-25 LAB — CMP14+EGFR
ALK PHOS: 75 IU/L (ref 39–117)
ALT: 14 IU/L (ref 0–32)
AST: 16 IU/L (ref 0–40)
Albumin/Globulin Ratio: 1.4 (ref 1.2–2.2)
Albumin: 4 g/dL (ref 3.6–4.8)
BUN/Creatinine Ratio: 14 (ref 12–28)
BUN: 13 mg/dL (ref 8–27)
Bilirubin Total: 0.8 mg/dL (ref 0.0–1.2)
CO2: 26 mmol/L (ref 18–29)
CREATININE: 0.96 mg/dL (ref 0.57–1.00)
Calcium: 10.2 mg/dL (ref 8.7–10.3)
Chloride: 100 mmol/L (ref 96–106)
GFR calc Af Amer: 73 mL/min/{1.73_m2} (ref >59)
GFR calc non Af Amer: 63 mL/min/{1.73_m2} (ref >59)
GLOBULIN, TOTAL: 2.8 g/dL (ref 1.5–4.5)
Glucose: 110 mg/dL — ABNORMAL HIGH (ref 65–99)
POTASSIUM: 4.7 mmol/L (ref 3.5–5.2)
SODIUM: 140 mmol/L (ref 134–144)
Total Protein: 6.8 g/dL (ref 6.0–8.5)

## 2015-10-25 LAB — THYROID PANEL WITH TSH
FREE THYROXINE INDEX: 2.9 (ref 1.2–4.9)
T3 UPTAKE RATIO: 28 % (ref 24–39)
T4, Total: 10.5 ug/dL (ref 4.5–12.0)
TSH: 0.734 u[IU]/mL (ref 0.450–4.500)

## 2016-03-06 ENCOUNTER — Encounter: Payer: BLUE CROSS/BLUE SHIELD | Admitting: *Deleted

## 2016-04-27 ENCOUNTER — Other Ambulatory Visit: Payer: Self-pay | Admitting: Nurse Practitioner

## 2016-04-27 DIAGNOSIS — I10 Essential (primary) hypertension: Secondary | ICD-10-CM

## 2016-04-27 DIAGNOSIS — F32A Depression, unspecified: Secondary | ICD-10-CM

## 2016-04-27 DIAGNOSIS — F329 Major depressive disorder, single episode, unspecified: Secondary | ICD-10-CM

## 2016-04-29 ENCOUNTER — Encounter: Payer: Self-pay | Admitting: Nurse Practitioner

## 2016-04-29 ENCOUNTER — Ambulatory Visit (INDEPENDENT_AMBULATORY_CARE_PROVIDER_SITE_OTHER): Payer: BLUE CROSS/BLUE SHIELD | Admitting: Nurse Practitioner

## 2016-04-29 VITALS — BP 138/81 | HR 78 | Temp 97.3°F | Ht 62.0 in | Wt 211.0 lb

## 2016-04-29 DIAGNOSIS — E890 Postprocedural hypothyroidism: Secondary | ICD-10-CM | POA: Diagnosis not present

## 2016-04-29 DIAGNOSIS — F3342 Major depressive disorder, recurrent, in full remission: Secondary | ICD-10-CM | POA: Diagnosis not present

## 2016-04-29 DIAGNOSIS — I10 Essential (primary) hypertension: Secondary | ICD-10-CM

## 2016-04-29 DIAGNOSIS — Z1211 Encounter for screening for malignant neoplasm of colon: Secondary | ICD-10-CM

## 2016-04-29 DIAGNOSIS — F411 Generalized anxiety disorder: Secondary | ICD-10-CM | POA: Diagnosis not present

## 2016-04-29 DIAGNOSIS — E782 Mixed hyperlipidemia: Secondary | ICD-10-CM | POA: Diagnosis not present

## 2016-04-29 DIAGNOSIS — Z23 Encounter for immunization: Secondary | ICD-10-CM

## 2016-04-29 DIAGNOSIS — R739 Hyperglycemia, unspecified: Secondary | ICD-10-CM

## 2016-04-29 MED ORDER — DULOXETINE HCL 30 MG PO CPEP: 30.0000 mg | ORAL_CAPSULE | Freq: Every day | ORAL | 1 refills | Status: DC

## 2016-04-29 MED ORDER — SIMVASTATIN 40 MG PO TABS: 40.0000 mg | ORAL_TABLET | Freq: Every day | ORAL | 1 refills | Status: DC

## 2016-04-29 MED ORDER — LOSARTAN POTASSIUM 100 MG PO TABS: 100.0000 mg | ORAL_TABLET | Freq: Every day | ORAL | 1 refills | Status: DC

## 2016-04-29 MED ORDER — LEVOTHYROXINE SODIUM 50 MCG PO TABS: 50.0000 ug | ORAL_TABLET | Freq: Every day | ORAL | 1 refills | Status: DC

## 2016-04-29 NOTE — Patient Instructions (Signed)
Stress and Stress Management Stress is a normal reaction to life events. It is what you feel when life demands more than you are used to or more than you can handle. Some stress can be useful. For example, the stress reaction can help you catch the last bus of the day, study for a test, or meet a deadline at work. But stress that occurs too often or for too long can cause problems. It can affect your emotional health and interfere with relationships and normal daily activities. Too much stress can weaken your immune system and increase your risk for physical illness. If you already have a medical problem, stress can make it worse. What are the causes? All sorts of life events may cause stress. An event that causes stress for one person may not be stressful for another person. Major life events commonly cause stress. These may be positive or negative. Examples include losing your job, moving into a new home, getting married, having a baby, or losing a loved one. Less obvious life events may also cause stress, especially if they occur day after day or in combination. Examples include working long hours, driving in traffic, caring for children, being in debt, or being in a difficult relationship. What are the signs or symptoms? Stress may cause emotional symptoms including, the following:  Anxiety. This is feeling worried, afraid, on edge, overwhelmed, or out of control.  Anger. This is feeling irritated or impatient.  Depression. This is feeling sad, down, helpless, or guilty.  Difficulty focusing, remembering, or making decisions. Stress may cause physical symptoms, including the following:  Aches and pains. These may affect your head, neck, back, stomach, or other areas of your body.  Tight muscles or clenched jaw.  Low energy or trouble sleeping. Stress may cause unhealthy behaviors, including the following:  Eating to feel better (overeating) or skipping meals.  Sleeping too little, too  much, or both.  Working too much or putting off tasks (procrastination).  Smoking, drinking alcohol, or using drugs to feel better. How is this diagnosed? Stress is diagnosed through an assessment by your health care provider. Your health care provider will ask questions about your symptoms and any stressful life events.Your health care provider will also ask about your medical history and may order blood tests or other tests. Certain medical conditions and medicine can cause physical symptoms similar to stress. Mental illness can cause emotional symptoms and unhealthy behaviors similar to stress. Your health care provider may refer you to a mental health professional for further evaluation. How is this treated? Stress management is the recommended treatment for stress.The goals of stress management are reducing stressful life events and coping with stress in healthy ways. Techniques for reducing stressful life events include the following:  Stress identification. Self-monitor for stress and identify what causes stress for you. These skills may help you to avoid some stressful events.  Time management. Set your priorities, keep a calendar of events, and learn to say "no." These tools can help you avoid making too many commitments. Techniques for coping with stress include the following:  Rethinking the problem. Try to think realistically about stressful events rather than ignoring them or overreacting. Try to find the positives in a stressful situation rather than focusing on the negatives.  Exercise. Physical exercise can release both physical and emotional tension. The key is to find a form of exercise you enjoy and do it regularly.  Relaxation techniques. These relax the body and mind. Examples include yoga,  meditation, tai chi, biofeedback, deep breathing, progressive muscle relaxation, listening to music, being out in nature, journaling, and other hobbies. Again, the key is to find one or  more that you enjoy and can do regularly.  Healthy lifestyle. Eat a balanced diet, get plenty of sleep, and do not smoke. Avoid using alcohol or drugs to relax.  Strong support network. Spend time with family, friends, or other people you enjoy being around.Express your feelings and talk things over with someone you trust. Counseling or talktherapy with a mental health professional may be helpful if you are having difficulty managing stress on your own. Medicine is typically not recommended for the treatment of stress.Talk to your health care provider if you think you need medicine for symptoms of stress. Follow these instructions at home:  Keep all follow-up visits as directed by your health care provider.  Take all medicines as directed by your health care provider. Contact a health care provider if:  Your symptoms get worse or you start having new symptoms.  You feel overwhelmed by your problems and can no longer manage them on your own. Get help right away if:  You feel like hurting yourself or someone else. This information is not intended to replace advice given to you by your health care provider. Make sure you discuss any questions you have with your health care provider. Document Released: 08/21/2000 Document Revised: 08/03/2015 Document Reviewed: 10/20/2012 Elsevier Interactive Patient Education  2017 [employer].

## 2016-04-29 NOTE — Progress Notes (Signed)
Subjective:    Patient ID: Linda Hernandez, female    DOB: 04-15-1950, 66 y.o.   MRN: [id number]  Patient here today for follow up of chronic medical problems. No changes since last visit. She has no complaints to day.  Outpatient Encounter Prescriptions as of 04/29/2016  Medication Sig  . Ascorbic Acid (VITAMIN C) 100 MG tablet Take 100 mg by mouth daily.  . Calcium Carbonate (CALTRATE 600 PO) Take by mouth.  . cholecalciferol (VITAMIN D) 1000 UNITS tablet Take 1,000 Units by mouth daily.  . cyanocobalamin 1000 MCG tablet Take 1,000 mcg by mouth daily.  . DULoxetine (CYMBALTA) 30 MG capsule Take 1 capsule (30 mg total) by mouth daily.  . fish oil-omega-3 fatty acids 1000 MG capsule Take 2 g by mouth daily.  Marland Kitchen levothyroxine (SYNTHROID, LEVOTHROID) 50 MCG tablet Take 1 tablet (50 mcg total) by mouth daily.  Marland Kitchen losartan (COZAAR) 100 MG tablet Take 1 tablet (100 mg total) by mouth daily.  . simvastatin (ZOCOR) 40 MG tablet Take 1 tablet (40 mg total) by mouth at bedtime.   No facility-administered encounter medications on file as of 04/29/2016.      Hypertension  This is a chronic problem. The current episode started more than 1 year ago. The problem is unchanged. The problem is uncontrolled. Pertinent negatives include no palpitations or shortness of breath. Risk factors for coronary artery disease include dyslipidemia, obesity and post-menopausal state. Past treatments include angiotensin blockers. The current treatment provides moderate improvement. Compliance problems include diet and exercise.  Identifiable causes of hypertension include a thyroid problem.  Hyperlipidemia  This is a chronic problem. The current episode started more than 1 year ago. The problem is resistant. Recent lipid tests were reviewed and are variable. Pertinent negatives include no shortness of breath. Current antihyperlipidemic treatment includes statins. The current treatment provides moderate improvement of lipids.  Compliance problems include adherence to diet and adherence to exercise.  Risk factors for coronary artery disease include dyslipidemia, hypertension, obesity and post-menopausal.  Thyroid Problem  Visit type: hypothyroidism. Patient reports no diarrhea, heat intolerance, menstrual problem or palpitations. Her past medical history is significant for hyperlipidemia.  GAD/depression Cymbalta working well- keeps her from worrying so much.   Review of Systems  Respiratory: Negative for shortness of breath.   Cardiovascular: Negative for palpitations.  Gastrointestinal: Negative for diarrhea.  Endocrine: Negative for heat intolerance.  Genitourinary: Negative for menstrual problem.  All other systems reviewed and are negative.      Objective:   Physical Exam  Constitutional: She is oriented to person, place, and time. She appears well-developed and well-nourished.  HENT:  Nose: Nose normal.  Mouth/Throat: Oropharynx is clear and moist.  Eyes: EOM are normal.  Neck: Trachea normal, normal range of motion and full passive range of motion without pain. Neck supple. No JVD present. Carotid bruit is not present. No thyromegaly present.  Cardiovascular: Normal rate, regular rhythm, normal heart sounds and intact distal pulses.  Exam reveals no gallop and no friction rub.   No murmur heard. Pulmonary/Chest: Effort normal and breath sounds normal.  Abdominal: Soft. Bowel sounds are normal. She exhibits no distension and no mass. There is no tenderness.  Musculoskeletal: Normal range of motion.  Lymphadenopathy:    She has no cervical adenopathy.  Neurological: She is alert and oriented to person, place, and time. She has normal reflexes.  Skin: Skin is warm and dry.  Psychiatric: She has a normal mood and affect. Her behavior is normal.  Judgment and thought content normal.    BP 138/81   Pulse 78   Temp 97.3 F (36.3 C) (Oral)   Ht 5' 2"  (1.575 m)   Wt 211 lb (95.7 kg)   LMP 06/01/1997    BMI 38.59 kg/m         Assessment & Plan:  1. Essential hypertension low sodium diet - losartan (COZAAR) 100 MG tablet; Take 1 tablet (100 mg total) by mouth daily.  Dispense: 90 tablet; Refill: 1 - CMP14+EGFR  2. Postoperative hypothyroidism - levothyroxine (SYNTHROID, LEVOTHROID) 50 MCG tablet; Take 1 tablet (50 mcg total) by mouth daily.  Dispense: 90 tablet; Refill: 1 - Thyroid Panel With TSH  3. Recurrent major depressive disorder, in full remission ([place]) Stress management - DULoxetine (CYMBALTA) 30 MG capsule; Take 1 capsule (30 mg total) by mouth daily.  Dispense: 90 capsule; Refill: 1  4. GAD (generalized anxiety disorder)  5. Mixed hyperlipidemia Low fat diet - simvastatin (ZOCOR) 40 MG tablet; Take 1 tablet (40 mg total) by mouth at bedtime.  Dispense: 90 tablet; Refill: 1 - Lipid panel  6. Morbid obesity ([place]) Discussed diet and exercise for person with BMI >25 Will recheck weight in 3-6 months   Referral made for colonoscopy Labs pending Health maintenance reviewed Diet and exercise encouraged Continue all meds Follow up  In 6 months   [place], FNP

## 2016-04-29 NOTE — Addendum Note (Signed)
Addended by: Rolena Infante on: 04/29/2016 03:54 PM   Modules accepted: Orders

## 2016-04-30 ENCOUNTER — Encounter: Payer: Self-pay | Admitting: Internal Medicine

## 2016-04-30 LAB — CMP14+EGFR
ALT: 15 IU/L (ref 0–32)
AST: 17 IU/L (ref 0–40)
Albumin/Globulin Ratio: 1.8 (ref 1.2–2.2)
Albumin: 4.5 g/dL (ref 3.6–4.8)
Alkaline Phosphatase: 77 IU/L (ref 39–117)
BUN/Creatinine Ratio: 19 (ref 12–28)
BUN: 16 mg/dL (ref 8–27)
Bilirubin Total: 0.7 mg/dL (ref 0.0–1.2)
CO2: 24 mmol/L (ref 18–29)
Calcium: 10.5 mg/dL — ABNORMAL HIGH (ref 8.7–10.3)
Chloride: 97 mmol/L (ref 96–106)
Creatinine, Ser: 0.86 mg/dL (ref 0.57–1.00)
GFR calc Af Amer: 83 mL/min/{1.73_m2} (ref >59)
GFR, EST NON AFRICAN AMERICAN: 72 mL/min/{1.73_m2} (ref >59)
GLOBULIN, TOTAL: 2.5 g/dL (ref 1.5–4.5)
Glucose: 122 mg/dL — ABNORMAL HIGH (ref 65–99)
Potassium: 5 mmol/L (ref 3.5–5.2)
SODIUM: 136 mmol/L (ref 134–144)
Total Protein: 7 g/dL (ref 6.0–8.5)

## 2016-04-30 LAB — THYROID PANEL WITH TSH
Free Thyroxine Index: 2.5 (ref 1.2–4.9)
T3 Uptake Ratio: 27 % (ref 24–39)
T4, Total: 9.4 ug/dL (ref 4.5–12.0)
TSH: 0.683 u[IU]/mL (ref 0.450–4.500)

## 2016-04-30 LAB — LIPID PANEL
CHOL/HDL RATIO: 2.5 ratio (ref 0.0–4.4)
Cholesterol, Total: 170 mg/dL (ref 100–199)
HDL: 69 mg/dL (ref >39)
LDL CALC: 64 mg/dL (ref 0–99)
TRIGLYCERIDES: 184 mg/dL — AB (ref 0–149)
VLDL CHOLESTEROL CAL: 37 mg/dL (ref 5–40)

## 2016-04-30 LAB — BAYER DCA HB A1C WAIVED: HB A1C (BAYER DCA - WAIVED): 6.1 % (ref <7.0)

## 2016-04-30 NOTE — Addendum Note (Signed)
Addended by: Rolena Infante on: 04/30/2016 09:52 AM   Modules accepted: Orders

## 2016-06-05 ENCOUNTER — Ambulatory Visit (AMBULATORY_SURGERY_CENTER): Payer: Self-pay

## 2016-06-05 VITALS — Ht 64.0 in | Wt 213.6 lb

## 2016-06-05 DIAGNOSIS — Z1211 Encounter for screening for malignant neoplasm of colon: Secondary | ICD-10-CM

## 2016-06-05 NOTE — Progress Notes (Signed)
No allergies to eggs or soy No diet meds No home oxygen No past problems with anesthesia except difficult to wake up and nausea with general anesthesia (d and c)  Declined emmi

## 2016-06-06 ENCOUNTER — Encounter: Payer: Self-pay | Admitting: Internal Medicine

## 2016-06-19 ENCOUNTER — Ambulatory Visit (AMBULATORY_SURGERY_CENTER): Payer: BLUE CROSS/BLUE SHIELD | Admitting: Internal Medicine

## 2016-06-19 ENCOUNTER — Encounter: Payer: Self-pay | Admitting: Internal Medicine

## 2016-06-19 VITALS — BP 125/68 | HR 78 | Temp 98.2°F | Resp 15 | Ht 64.0 in | Wt 213.0 lb

## 2016-06-19 DIAGNOSIS — Z1211 Encounter for screening for malignant neoplasm of colon: Secondary | ICD-10-CM

## 2016-06-19 DIAGNOSIS — D123 Benign neoplasm of transverse colon: Secondary | ICD-10-CM

## 2016-06-19 DIAGNOSIS — Z86010 Personal history of colonic polyps: Secondary | ICD-10-CM

## 2016-06-19 DIAGNOSIS — Z1212 Encounter for screening for malignant neoplasm of rectum: Secondary | ICD-10-CM

## 2016-06-19 DIAGNOSIS — Z860101 Personal history of adenomatous and serrated colon polyps: Secondary | ICD-10-CM

## 2016-06-19 HISTORY — DX: Personal history of adenomatous and serrated colon polyps: Z86.0101

## 2016-06-19 HISTORY — DX: Personal history of colonic polyps: Z86.010

## 2016-06-19 MED ORDER — SODIUM CHLORIDE 0.9 % IV SOLN
500.0000 mL | INTRAVENOUS | Status: AC
Start: 2016-06-19 — End: ?

## 2016-06-19 NOTE — Progress Notes (Signed)
Called to room to assist during endoscopic procedure.  Patient ID and intended procedure confirmed with present staff. Received instructions for my participation in the procedure from the performing physician.  

## 2016-06-19 NOTE — Progress Notes (Signed)
A/ox3 pleased with MAC, report to [employer]

## 2016-06-19 NOTE — Op Note (Signed)
[address] Patient Name: Linda Hernandez Procedure Date: 06/19/2016 9:19 AM MRN: [id number] Endoscopist: Gatha Mayer , MD Age: 66 Referring MD:  Date of Birth: Mar 24, 1950 Gender: Female Account #: [id number] Procedure:                Colonoscopy Indications:              Screening for colorectal malignant neoplasm Medicines:                Propofol per Anesthesia, Monitored Anesthesia Care Procedure:                Pre-Anesthesia Assessment:                           - Prior to the procedure, a History and Physical                            was performed, and patient medications and                            allergies were reviewed. The patient's tolerance of                            previous anesthesia was also reviewed. The risks                            and benefits of the procedure and the sedation                            options and risks were discussed with the patient.                            All questions were answered, and informed consent                            was obtained. Prior Anticoagulants: The patient has                            taken no previous anticoagulant or antiplatelet                            agents. ASA Grade Assessment: II - A patient with                            mild systemic disease. After reviewing the risks                            and benefits, the patient was deemed in                            satisfactory condition to undergo the procedure.                           After obtaining informed consent, the colonoscope  was passed under direct vision. Throughout the                            procedure, the patient's blood pressure, pulse, and                            oxygen saturations were monitored continuously. The                            Model PCF-H190DL [id number]) scope was introduced                            through the anus and advanced to the the cecum,                             identified by appendiceal orifice and ileocecal                            valve. The colonoscopy was performed without                            difficulty. The patient tolerated the procedure                            well. The quality of the bowel preparation was                            excellent. The bowel preparation used was Miralax.                            The ileocecal valve, appendiceal orifice, and                            rectum were photographed. Scope In: 9:21:37 AM Scope Out: 9:36:32 AM Scope Withdrawal Time: 0 hours 12 minutes 21 seconds  Total Procedure Duration: 0 hours 14 minutes 55 seconds  Findings:                 The perianal and digital rectal examinations were                            normal.                           A 2 mm polyp was found in the transverse colon. The                            polyp was sessile. The polyp was removed with a                            cold biopsy forceps. Resection and retrieval were                            complete. Verification of patient identification  for the specimen was done. Estimated blood loss was                            minimal.                           A 5 mm polyp was found in the transverse colon. The                            polyp was sessile. The polyp was removed with a                            cold snare. Resection and retrieval were complete.                            Verification of patient identification for the                            specimen was done. Estimated blood loss was minimal.                           Many small and large-mouthed diverticula were found                            in the sigmoid colon.                           The exam was otherwise without abnormality on                            direct and retroflexion views. Complications:            No immediate complications. Estimated Blood Loss:     Estimated blood loss was  minimal. Impression:               - One 2 mm polyp in the transverse colon, removed                            with a cold biopsy forceps. Resected and retrieved.                           - One 5 mm polyp in the transverse colon, removed                            with a cold snare. Resected and retrieved.                           - Diverticulosis in the sigmoid colon.                           - The examination was otherwise normal on direct                            and retroflexion views. Recommendation:           -  Patient has a contact number available for                            emergencies. The signs and symptoms of potential                            delayed complications were discussed with the                            patient. Return to normal activities tomorrow.                            Written discharge instructions were provided to the                            patient.                           - Resume previous diet.                           - Continue present medications.                           - Repeat colonoscopy is recommended. The                            colonoscopy date will be determined after pathology                            results from today's exam become available for                            review. Gatha Mayer, MD 06/19/2016 10:12:00 AM This report has been signed electronically.

## 2016-06-19 NOTE — Patient Instructions (Addendum)
   I found and removed 2 small polyps. They look benign. I will let you know pathology results and when to have another routine colonoscopy by mail and/or My Chart.  I appreciate the opportunity to care for you. Gatha Mayer, MD, FACG  YOU HAD AN ENDOSCOPIC PROCEDURE TODAY AT [facility]:   Refer to the procedure report that was given to you for any specific questions about what was found during the examination.  If the procedure report does not answer your questions, please call your gastroenterologist to clarify.  If you requested that your care partner not be given the details of your procedure findings, then the procedure report has been included in a sealed envelope for you to review at your convenience later.  YOU SHOULD EXPECT: Some feelings of bloating in the abdomen. Passage of more gas than usual.  Walking can help get rid of the air that was put into your GI tract during the procedure and reduce the bloating. If you had a lower endoscopy (such as a colonoscopy or flexible sigmoidoscopy) you may notice spotting of blood in your stool or on the toilet paper. If you underwent a bowel prep for your procedure, you may not have a normal bowel movement for a few days.  Please Note:  You might notice some irritation and congestion in your nose or some drainage.  This is from the oxygen used during your procedure.  There is no need for concern and it should clear up in a day or so.  SYMPTOMS TO REPORT IMMEDIATELY:   Following lower endoscopy (colonoscopy or flexible sigmoidoscopy):  Excessive amounts of blood in the stool  Significant tenderness or worsening of abdominal pains  Swelling of the abdomen that is new, acute  Fever of 100F or higher  For urgent or emergent issues, a gastroenterologist can be reached at any hour by calling [phone].   DIET:  We do recommend a small meal at first, but then you may proceed to your regular diet.  Drink plenty of  fluids but you should avoid alcoholic beverages for 24 hours.  ACTIVITY:  You should plan to take it easy for the rest of today and you should NOT DRIVE or use heavy machinery until tomorrow (because of the sedation medicines used during the test).    FOLLOW UP: Our staff will call the number listed on your records the next business day following your procedure to check on you and address any questions or concerns that you may have regarding the information given to you following your procedure. If we do not reach you, we will leave a message.  However, if you are feeling well and you are not experiencing any problems, there is no need to return our call.  We will assume that you have returned to your regular daily activities without incident.  If any biopsies were taken you will be contacted by phone or by letter within the next 1-3 weeks.  Please call us at [phone] if you have not heard about the biopsies in 3 weeks.    SIGNATURES/CONFIDENTIALITY: You and/or your care partner have signed paperwork which will be entered into your electronic medical record.  These signatures attest to the fact that that the information above on your After Visit Summary has been reviewed and is understood.  Full responsibility of the confidentiality of this discharge information lies with you and/or your care-partner.  Polyp and diverticulosis information given.

## 2016-06-20 ENCOUNTER — Telehealth: Payer: Self-pay

## 2016-06-20 ENCOUNTER — Telehealth: Payer: Self-pay | Admitting: *Deleted

## 2016-06-20 NOTE — Telephone Encounter (Signed)
  Follow up Call-  Call back number 06/19/2016  Post procedure Call Back phone  # [phone]  Permission to leave phone message Yes  Some recent data might be hidden     No answer at # given.  Left message on VM.

## 2016-06-20 NOTE — Telephone Encounter (Signed)
  Follow up Call-  Call back number 06/19/2016  Post procedure Call Back phone  # [phone]  Permission to leave phone message Yes  Some recent data might be hidden     Patient questions:  Do you have a fever, pain , or abdominal swelling? No. Pain Score  0 *  Have you tolerated food without any problems? Yes.    Have you been able to return to your normal activities? Yes.    Do you have any questions about your discharge instructions: Diet   No. Medications  No. Follow up visit  No.  Do you have questions or concerns about your Care? No.  Actions: * If pain score is 4 or above: No action needed, pain <4.

## 2016-07-04 ENCOUNTER — Encounter: Payer: Self-pay | Admitting: Internal Medicine

## 2016-07-04 NOTE — Progress Notes (Signed)
2 adenomas - diminutive Recall 2023 My Chart letter

## 2016-10-04 ENCOUNTER — Encounter: Payer: Self-pay | Admitting: Nurse Practitioner

## 2016-10-04 ENCOUNTER — Ambulatory Visit (INDEPENDENT_AMBULATORY_CARE_PROVIDER_SITE_OTHER): Payer: BLUE CROSS/BLUE SHIELD | Admitting: Nurse Practitioner

## 2016-10-04 VITALS — BP 131/80 | HR 63 | Temp 96.1°F | Ht 64.0 in | Wt 209.0 lb

## 2016-10-04 DIAGNOSIS — E890 Postprocedural hypothyroidism: Secondary | ICD-10-CM

## 2016-10-04 DIAGNOSIS — F411 Generalized anxiety disorder: Secondary | ICD-10-CM

## 2016-10-04 DIAGNOSIS — F3342 Major depressive disorder, recurrent, in full remission: Secondary | ICD-10-CM

## 2016-10-04 DIAGNOSIS — I10 Essential (primary) hypertension: Secondary | ICD-10-CM

## 2016-10-04 DIAGNOSIS — E782 Mixed hyperlipidemia: Secondary | ICD-10-CM

## 2016-10-04 MED ORDER — DULOXETINE HCL 30 MG PO CPEP: 30.0000 mg | ORAL_CAPSULE | Freq: Every day | ORAL | 1 refills | Status: DC

## 2016-10-04 MED ORDER — LEVOTHYROXINE SODIUM 50 MCG PO TABS: 50.0000 ug | ORAL_TABLET | Freq: Every day | ORAL | 1 refills | Status: DC

## 2016-10-04 MED ORDER — SIMVASTATIN 40 MG PO TABS: 40.0000 mg | ORAL_TABLET | Freq: Every day | ORAL | 1 refills | Status: DC

## 2016-10-04 MED ORDER — LOSARTAN POTASSIUM 100 MG PO TABS: 100.0000 mg | ORAL_TABLET | Freq: Every day | ORAL | 1 refills | Status: DC

## 2016-10-04 NOTE — Patient Instructions (Signed)
Stress and Stress Management Stress is a normal reaction to life events. It is what you feel when life demands more than you are used to or more than you can handle. Some stress can be useful. For example, the stress reaction can help you catch the last bus of the day, study for a test, or meet a deadline at work. But stress that occurs too often or for too long can cause problems. It can affect your emotional health and interfere with relationships and normal daily activities. Too much stress can weaken your immune system and increase your risk for physical illness. If you already have a medical problem, stress can make it worse. What are the causes? All sorts of life events may cause stress. An event that causes stress for one person may not be stressful for another person. Major life events commonly cause stress. These may be positive or negative. Examples include losing your job, moving into a new home, getting married, having a baby, or losing a loved one. Less obvious life events may also cause stress, especially if they occur day after day or in combination. Examples include working long hours, driving in traffic, caring for children, being in debt, or being in a difficult relationship. What are the signs or symptoms? Stress may cause emotional symptoms including, the following:  Anxiety. This is feeling worried, afraid, on edge, overwhelmed, or out of control.  Anger. This is feeling irritated or impatient.  Depression. This is feeling sad, down, helpless, or guilty.  Difficulty focusing, remembering, or making decisions.  Stress may cause physical symptoms, including the following:  Aches and pains. These may affect your head, neck, back, stomach, or other areas of your body.  Tight muscles or clenched jaw.  Low energy or trouble sleeping.  Stress may cause unhealthy behaviors, including the following:  Eating to feel better (overeating) or skipping meals.  Sleeping too little,  too much, or both.  Working too much or putting off tasks (procrastination).  Smoking, drinking alcohol, or using drugs to feel better.  How is this diagnosed? Stress is diagnosed through an assessment by your health care provider. Your health care provider will ask questions about your symptoms and any stressful life events.Your health care provider will also ask about your medical history and may order blood tests or other tests. Certain medical conditions and medicine can cause physical symptoms similar to stress. Mental illness can cause emotional symptoms and unhealthy behaviors similar to stress. Your health care provider may refer you to a mental health professional for further evaluation. How is this treated? Stress management is the recommended treatment for stress.The goals of stress management are reducing stressful life events and coping with stress in healthy ways. Techniques for reducing stressful life events include the following:  Stress identification. Self-monitor for stress and identify what causes stress for you. These skills may help you to avoid some stressful events.  Time management. Set your priorities, keep a calendar of events, and learn to say "no." These tools can help you avoid making too many commitments.  Techniques for coping with stress include the following:  Rethinking the problem. Try to think realistically about stressful events rather than ignoring them or overreacting. Try to find the positives in a stressful situation rather than focusing on the negatives.  Exercise. Physical exercise can release both physical and emotional tension. The key is to find a form of exercise you enjoy and do it regularly.  Relaxation techniques. These relax the body and  mind. Examples include yoga, meditation, tai chi, biofeedback, deep breathing, progressive muscle relaxation, listening to music, being out in nature, journaling, and other hobbies. Again, the key is to find  one or more that you enjoy and can do regularly.  Healthy lifestyle. Eat a balanced diet, get plenty of sleep, and do not smoke. Avoid using alcohol or drugs to relax.  Strong support network. Spend time with family, friends, or other people you enjoy being around.Express your feelings and talk things over with someone you trust.  Counseling or talktherapy with a mental health professional may be helpful if you are having difficulty managing stress on your own. Medicine is typically not recommended for the treatment of stress.Talk to your health care provider if you think you need medicine for symptoms of stress. Follow these instructions at home:  Keep all follow-up visits as directed by your health care provider.  Take all medicines as directed by your health care provider. Contact a health care provider if:  Your symptoms get worse or you start having new symptoms.  You feel overwhelmed by your problems and can no longer manage them on your own. Get help right away if:  You feel like hurting yourself or someone else. This information is not intended to replace advice given to you by your health care provider. Make sure you discuss any questions you have with your health care provider. Document Released: 08/21/2000 Document Revised: 08/03/2015 Document Reviewed: 10/20/2012 Elsevier Interactive Patient Education  2017 [employer]  

## 2016-10-04 NOTE — Progress Notes (Signed)
Subjective:    Patient ID: Linda Hernandez, female    DOB: 09-May-1950, 66 y.o.   MRN: [id number]  HPI Linda Hernandez is here today for follow up of chronic medical problem.  Outpatient Encounter Prescriptions as of 10/04/2016  Medication Sig  . Ascorbic Acid (VITAMIN C) 100 MG tablet Take 100 mg by mouth daily.  . Calcium Carbonate (CALTRATE 600 PO) Take by mouth.  . Cholecalciferol (VITAMIN D) 2000 units tablet Take 2,000 Units by mouth daily.   . cyanocobalamin 1000 MCG tablet Take 1,000 mcg by mouth daily.  . DULoxetine (CYMBALTA) 30 MG capsule Take 1 capsule (30 mg total) by mouth daily.  . fish oil-omega-3 fatty acids 1000 MG capsule Take 2 g by mouth daily.  Marland Kitchen levothyroxine (SYNTHROID, LEVOTHROID) 50 MCG tablet Take 1 tablet (50 mcg total) by mouth daily.  Marland Kitchen losartan (COZAAR) 100 MG tablet Take 1 tablet (100 mg total) by mouth daily.  . Multiple Vitamin (MULTIVITAMIN) tablet Take 1 tablet by mouth daily.  . simvastatin (ZOCOR) 40 MG tablet Take 1 tablet (40 mg total) by mouth at bedtime.   Facility-Administered Encounter Medications as of 10/04/2016  Medication  . 0.9 %  sodium chloride infusion    1. Essential hypertension  Patient manages blood pressure with losartan.  Patient checks blood pressure at home.  2. Mixed hyperlipidemia  Patient managed with simvastatin.  3. GAD (generalized anxiety disorder) Anxiety managed with Cymbalta.   4. Recurrent major depressive disorder, in full remission ([place])  Managed with Cymbalta.  5. Morbid obesity ([place])  No significant weight gain or loss.  6. Postoperative hypothyroidism  Patient takes levothyroxine.    New complaints: None today.  Social history:    Review of Systems  Constitutional: Negative for activity change, appetite change and fatigue.  Respiratory: Negative for cough and shortness of breath.   Cardiovascular: Negative for chest pain and palpitations.  Gastrointestinal: Negative for abdominal pain.    Neurological: Negative for dizziness, light-headedness and headaches.  All other systems reviewed and are negative.      Objective:   Physical Exam  Constitutional: She is oriented to person, place, and time. She appears well-developed and well-nourished. No distress.  HENT:  Head: Normocephalic.  Right Ear: External ear normal.  Left Ear: External ear normal.  Mouth/Throat: Oropharynx is clear and moist.  Eyes: Pupils are equal, round, and reactive to light.  Neck: Normal range of motion. Neck supple. No JVD present. No thyromegaly present.  Cardiovascular: Normal rate, regular rhythm, normal heart sounds and intact distal pulses.   No murmur heard. Pulmonary/Chest: Effort normal and breath sounds normal. No respiratory distress. She has no wheezes.  Abdominal: Soft. Bowel sounds are normal. She exhibits no distension. There is no tenderness.  Musculoskeletal: Normal range of motion. She exhibits no edema.  Lymphadenopathy:    She has no cervical adenopathy.  Neurological: She is alert and oriented to person, place, and time.  Skin: Skin is warm and dry.  Psychiatric: She has a normal mood and affect. Her behavior is normal. Judgment and thought content normal.   BP 131/80   Pulse 63   Temp (!) 96.1 F (35.6 C) (Oral)   Ht 5' 4"  (1.626 m)   Wt 209 lb (94.8 kg)   LMP 06/01/1997   BMI 35.87 kg/m      Assessment & Plan:  1. Essential hypertension Low sodium diet - losartan (COZAAR) 100 MG tablet; Take 1 tablet (100 mg total) by mouth  daily.  Dispense: 90 tablet; Refill: 1 - CMP14+EGFR  2. Mixed hyperlipidemia Low fat diet - simvastatin (ZOCOR) 40 MG tablet; Take 1 tablet (40 mg total) by mouth at bedtime.  Dispense: 90 tablet; Refill: 1 - Lipid panel  3. GAD (generalized anxiety disorder) Stress management  4. Recurrent major depressive disorder, in full remission ([place]) - DULoxetine (CYMBALTA) 30 MG capsule; Take 1 capsule (30 mg total) by mouth daily.  Dispense:  90 capsule; Refill: 1  5. Morbid obesity ([place]) Discussed diet and exercise for person with BMI >25 Will recheck weight in 3-6 months  6. Postoperative hypothyroidism - levothyroxine (SYNTHROID, LEVOTHROID) 50 MCG tablet; Take 1 tablet (50 mcg total) by mouth daily.  Dispense: 90 tablet; Refill: 1    Labs pending Health maintenance reviewed Diet and exercise encouraged Continue all meds Follow up  In 6 months   [place], FNP

## 2016-10-05 LAB — CMP14+EGFR
A/G RATIO: 1.7 (ref 1.2–2.2)
ALBUMIN: 4.5 g/dL (ref 3.6–4.8)
ALK PHOS: 78 IU/L (ref 39–117)
ALT: 18 IU/L (ref 0–32)
AST: 16 IU/L (ref 0–40)
BUN / CREAT RATIO: 16 (ref 12–28)
BUN: 15 mg/dL (ref 8–27)
Bilirubin Total: 1 mg/dL (ref 0.0–1.2)
CHLORIDE: 99 mmol/L (ref 96–106)
CO2: 24 mmol/L (ref 20–29)
Calcium: 10.6 mg/dL — ABNORMAL HIGH (ref 8.7–10.3)
Creatinine, Ser: 0.94 mg/dL (ref 0.57–1.00)
GFR calc non Af Amer: 64 mL/min/{1.73_m2} (ref >59)
GFR, EST AFRICAN AMERICAN: 74 mL/min/{1.73_m2} (ref >59)
GLUCOSE: 113 mg/dL — AB (ref 65–99)
Globulin, Total: 2.7 g/dL (ref 1.5–4.5)
POTASSIUM: 5.1 mmol/L (ref 3.5–5.2)
SODIUM: 137 mmol/L (ref 134–144)
TOTAL PROTEIN: 7.2 g/dL (ref 6.0–8.5)

## 2016-10-05 LAB — LIPID PANEL
CHOLESTEROL TOTAL: 162 mg/dL (ref 100–199)
Chol/HDL Ratio: 2.3 ratio (ref 0.0–4.4)
HDL: 70 mg/dL (ref >39)
LDL Calculated: 63 mg/dL (ref 0–99)
Triglycerides: 144 mg/dL (ref 0–149)
VLDL CHOLESTEROL CAL: 29 mg/dL (ref 5–40)

## 2016-10-28 ENCOUNTER — Ambulatory Visit: Payer: BLUE CROSS/BLUE SHIELD | Admitting: Nurse Practitioner

## 2017-03-18 ENCOUNTER — Other Ambulatory Visit: Payer: Self-pay | Admitting: Nurse Practitioner

## 2017-03-18 DIAGNOSIS — F3342 Major depressive disorder, recurrent, in full remission: Secondary | ICD-10-CM

## 2017-03-18 DIAGNOSIS — I10 Essential (primary) hypertension: Secondary | ICD-10-CM

## 2017-04-04 ENCOUNTER — Encounter: Payer: Self-pay | Admitting: Nurse Practitioner

## 2017-04-04 ENCOUNTER — Ambulatory Visit: Payer: Medicare Other | Admitting: Nurse Practitioner

## 2017-04-04 VITALS — BP 140/76 | HR 64 | Temp 97.1°F | Ht 64.0 in | Wt 213.0 lb

## 2017-04-04 DIAGNOSIS — Z1231 Encounter for screening mammogram for malignant neoplasm of breast: Secondary | ICD-10-CM

## 2017-04-04 DIAGNOSIS — F411 Generalized anxiety disorder: Secondary | ICD-10-CM | POA: Diagnosis not present

## 2017-04-04 DIAGNOSIS — F3342 Major depressive disorder, recurrent, in full remission: Secondary | ICD-10-CM

## 2017-04-04 DIAGNOSIS — E890 Postprocedural hypothyroidism: Secondary | ICD-10-CM

## 2017-04-04 DIAGNOSIS — I10 Essential (primary) hypertension: Secondary | ICD-10-CM | POA: Diagnosis not present

## 2017-04-04 DIAGNOSIS — E782 Mixed hyperlipidemia: Secondary | ICD-10-CM

## 2017-04-04 MED ORDER — LEVOTHYROXINE SODIUM 50 MCG PO TABS: 50.0000 ug | ORAL_TABLET | Freq: Every day | ORAL | 1 refills | Status: DC

## 2017-04-04 MED ORDER — DULOXETINE HCL 30 MG PO CPEP: 30.0000 mg | ORAL_CAPSULE | Freq: Every day | ORAL | 0 refills | Status: DC

## 2017-04-04 MED ORDER — LOSARTAN POTASSIUM 100 MG PO TABS: 100.0000 mg | ORAL_TABLET | Freq: Every day | ORAL | 1 refills | Status: DC

## 2017-04-04 MED ORDER — SIMVASTATIN 40 MG PO TABS: 40.0000 mg | ORAL_TABLET | Freq: Every day | ORAL | 1 refills | Status: DC

## 2017-04-04 NOTE — Patient Instructions (Signed)
DASH Eating Plan DASH stands for "Dietary Approaches to Stop Hypertension." The DASH eating plan is a healthy eating plan that has been shown to reduce high blood pressure (hypertension). It may also reduce your risk for type 2 diabetes, heart disease, and stroke. The DASH eating plan may also help with weight loss. What are tips for following this plan? General guidelines  Avoid eating more than 2,300 mg (milligrams) of salt (sodium) a day. If you have hypertension, you may need to reduce your sodium intake to 1,500 mg a day.  Limit alcohol intake to no more than 1 drink a day for nonpregnant women and 2 drinks a day for men. One drink equals 12 oz of beer, 5 oz of wine, or 1 oz of hard liquor.  Work with your health care provider to maintain a healthy body weight or to lose weight. Ask what an ideal weight is for you.  Get at least 30 minutes of exercise that causes your heart to beat faster (aerobic exercise) most days of the week. Activities may include walking, swimming, or biking.  Work with your health care provider or diet and nutrition specialist (dietitian) to adjust your eating plan to your individual calorie needs. Reading food labels  Check food labels for the amount of sodium per serving. Choose foods with less than 5 percent of the Daily Value of sodium. Generally, foods with less than 300 mg of sodium per serving fit into this eating plan.  To find whole grains, look for the word "whole" as the first word in the ingredient list. Shopping  Buy products labeled as "low-sodium" or "no salt added."  Buy fresh foods. Avoid canned foods and premade or frozen meals. Cooking  Avoid adding salt when cooking. Use salt-free seasonings or herbs instead of table salt or sea salt. Check with your health care provider or pharmacist before using salt substitutes.  Do not fry foods. Cook foods using healthy methods such as baking, boiling, grilling, and broiling instead.  Cook with  heart-healthy oils, such as olive, canola, soybean, or sunflower oil. Meal planning   Eat a balanced diet that includes: ? 5 or more servings of fruits and vegetables each day. At each meal, try to fill half of your plate with fruits and vegetables. ? Up to 6-8 servings of whole grains each day. ? Less than 6 oz of lean meat, poultry, or fish each day. A 3-oz serving of meat is about the same size as a deck of cards. One egg equals 1 oz. ? 2 servings of low-fat dairy each day. ? A serving of nuts, seeds, or beans 5 times each week. ? Heart-healthy fats. Healthy fats called Omega-3 fatty acids are found in foods such as flaxseeds and coldwater fish, like sardines, salmon, and mackerel.  Limit how much you eat of the following: ? Canned or prepackaged foods. ? Food that is high in trans fat, such as fried foods. ? Food that is high in saturated fat, such as fatty meat. ? Sweets, desserts, sugary drinks, and other foods with added sugar. ? Full-fat dairy products.  Do not salt foods before eating.  Try to eat at least 2 vegetarian meals each week.  Eat more home-cooked food and less restaurant, buffet, and fast food.  When eating at a restaurant, ask that your food be prepared with less salt or no salt, if possible. What foods are recommended? The items listed may not be a complete list. Talk with your dietitian about what   dietary choices are best for you. Grains Whole-grain or whole-wheat bread. Whole-grain or whole-wheat pasta. Brown rice. Oatmeal. Quinoa. Bulgur. Whole-grain and low-sodium cereals. Pita bread. Low-fat, low-sodium crackers. Whole-wheat flour tortillas. Vegetables Fresh or frozen vegetables (raw, steamed, roasted, or grilled). Low-sodium or reduced-sodium tomato and vegetable juice. Low-sodium or reduced-sodium tomato sauce and tomato paste. Low-sodium or reduced-sodium canned vegetables. Fruits All fresh, dried, or frozen fruit. Canned fruit in natural juice (without  added sugar). Meat and other protein foods Skinless chicken or turkey. Ground chicken or turkey. Pork with fat trimmed off. Fish and seafood. Egg whites. Dried beans, peas, or lentils. Unsalted nuts, nut butters, and seeds. Unsalted canned beans. Lean cuts of beef with fat trimmed off. Low-sodium, lean deli meat. Dairy Low-fat (1%) or fat-free (skim) milk. Fat-free, low-fat, or reduced-fat cheeses. Nonfat, low-sodium ricotta or cottage cheese. Low-fat or nonfat yogurt. Low-fat, low-sodium cheese. Fats and oils Soft margarine without trans fats. Vegetable oil. Low-fat, reduced-fat, or light mayonnaise and salad dressings (reduced-sodium). Canola, safflower, olive, soybean, and sunflower oils. Avocado. Seasoning and other foods Herbs. Spices. Seasoning mixes without salt. Unsalted popcorn and pretzels. Fat-free sweets. What foods are not recommended? The items listed may not be a complete list. Talk with your dietitian about what dietary choices are best for you. Grains Baked goods made with fat, such as croissants, muffins, or some breads. Dry pasta or rice meal packs. Vegetables Creamed or fried vegetables. Vegetables in a cheese sauce. Regular canned vegetables (not low-sodium or reduced-sodium). Regular canned tomato sauce and paste (not low-sodium or reduced-sodium). Regular tomato and vegetable juice (not low-sodium or reduced-sodium). Pickles. Olives. Fruits Canned fruit in a light or heavy syrup. Fried fruit. Fruit in cream or butter sauce. Meat and other protein foods Fatty cuts of meat. Ribs. Fried meat. Bacon. Sausage. Bologna and other processed lunch meats. Salami. Fatback. Hotdogs. Bratwurst. Salted nuts and seeds. Canned beans with added salt. Canned or smoked fish. Whole eggs or egg yolks. Chicken or turkey with skin. Dairy Whole or 2% milk, cream, and half-and-half. Whole or full-fat cream cheese. Whole-fat or sweetened yogurt. Full-fat cheese. Nondairy creamers. Whipped toppings.  Processed cheese and cheese spreads. Fats and oils Butter. Stick margarine. Lard. Shortening. Ghee. Bacon fat. Tropical oils, such as coconut, palm kernel, or palm oil. Seasoning and other foods Salted popcorn and pretzels. Onion salt, garlic salt, seasoned salt, table salt, and sea salt. Worcestershire sauce. Tartar sauce. Barbecue sauce. Teriyaki sauce. Soy sauce, including reduced-sodium. Steak sauce. Canned and packaged gravies. Fish sauce. Oyster sauce. Cocktail sauce. Horseradish that you find on the shelf. Ketchup. Mustard. Meat flavorings and tenderizers. Bouillon cubes. Hot sauce and Tabasco sauce. Premade or packaged marinades. Premade or packaged taco seasonings. Relishes. Regular salad dressings. Where to find more information:  National Heart, Lung, and Blood Institute: www.nhlbi.nih.gov  American Heart Association: www.heart.org Summary  The DASH eating plan is a healthy eating plan that has been shown to reduce high blood pressure (hypertension). It may also reduce your risk for type 2 diabetes, heart disease, and stroke.  With the DASH eating plan, you should limit salt (sodium) intake to 2,300 mg a day. If you have hypertension, you may need to reduce your sodium intake to 1,500 mg a day.  When on the DASH eating plan, aim to eat more fresh fruits and vegetables, whole grains, lean proteins, low-fat dairy, and heart-healthy fats.  Work with your health care provider or diet and nutrition specialist (dietitian) to adjust your eating plan to your individual   calorie needs. This information is not intended to replace advice given to you by your health care provider. Make sure you discuss any questions you have with your health care provider. Document Released: 02/14/2011 Document Revised: 02/19/2016 Document Reviewed: 02/19/2016 Elsevier Interactive Patient Education  2018 Elsevier Inc.  

## 2017-04-04 NOTE — Progress Notes (Signed)
Subjective:    Patient ID: Linda Hernandez, female    DOB: [birth year], 67 y.o.   MRN: [id number]  HPI  Linda Hernandez is here today for follow up of chronic medical problem.  Outpatient Encounter Medications as of 04/04/2017  Medication Sig  . Ascorbic Acid (VITAMIN C) 100 MG tablet Take 100 mg by mouth daily.  . Calcium Carbonate (CALTRATE 600 PO) Take by mouth.  . Cholecalciferol (VITAMIN D) 2000 units tablet Take 2,000 Units by mouth daily.   . cyanocobalamin 1000 MCG tablet Take 1,000 mcg by mouth daily.  . DULoxetine (CYMBALTA) 30 MG capsule TAKE 1 CAPSULE BY MOUTH  DAILY  . fish oil-omega-3 fatty acids 1000 MG capsule Take 2 g by mouth daily.  Marland Kitchen levothyroxine (SYNTHROID, LEVOTHROID) 50 MCG tablet Take 1 tablet (50 mcg total) by mouth daily.  Marland Kitchen losartan (COZAAR) 100 MG tablet TAKE 1 TABLET BY MOUTH  DAILY  . simvastatin (ZOCOR) 40 MG tablet Take 1 tablet (40 mg total) by mouth at bedtime.     1. Essential hypertension  Does not check blood pressure at home. No c/o chest pain, sob or headache. BP Readings from Last 3 Encounters:  04/04/17 140/76  10/04/16 131/80  06/19/16 125/68     2. Postoperative hypothyroidism  Not having any problems that she is aware of  3. Mixed hyperlipidemia  Does not watch diet  4. GAD (generalized anxiety disorder)  Only takes cymbalta which she says does help her  5. Recurrent major depressive disorder, in full remission ([place])  Again on cymbalta which helps with depression  6. Morbid obesity ([place])  No recent weight changes    New complaints: None today  Social history: Retired from [employer] in [place]    Review of Systems  Constitutional: Negative for activity change and appetite change.  HENT: Negative.   Eyes: Negative for pain.  Respiratory: Negative for shortness of breath.   Cardiovascular: Negative for chest pain, palpitations and leg swelling.  Gastrointestinal: Negative for abdominal pain.  Endocrine: Negative for  polydipsia.  Genitourinary: Negative.   Skin: Negative for rash.  Neurological: Negative for dizziness, weakness and headaches.  Hematological: Does not bruise/bleed easily.  Psychiatric/Behavioral: Negative.   All other systems reviewed and are negative.      Objective:   Physical Exam  Constitutional: She is oriented to person, place, and time. She appears well-developed and well-nourished.  HENT:  Nose: Nose normal.  Mouth/Throat: Oropharynx is clear and moist.  Eyes: EOM are normal.  Neck: Trachea normal, normal range of motion and full passive range of motion without pain. Neck supple. No JVD present. Carotid bruit is not present. No thyromegaly present.  Cardiovascular: Normal rate, regular rhythm, normal heart sounds and intact distal pulses. Exam reveals no gallop and no friction rub.  No murmur heard. Pulmonary/Chest: Effort normal and breath sounds normal.  Abdominal: Soft. Bowel sounds are normal. She exhibits no distension and no mass. There is no tenderness.  Musculoskeletal: Normal range of motion.  Lymphadenopathy:    She has no cervical adenopathy.  Neurological: She is alert and oriented to person, place, and time. She has normal reflexes.  Skin: Skin is warm and dry.  Psychiatric: She has a normal mood and affect. Her behavior is normal. Judgment and thought content normal.   BP 140/76   Pulse 64   Temp (!) 97.1 F (36.2 C) (Oral)   Ht _0  (1.626 m)   Wt 213 lb (96.6 kg)  LMP 06/01/1997   BMI 36.56 kg/m       Assessment & Plan:  1. Essential hypertension Low sodium diet - losartan (COZAAR) 100 MG tablet; Take 1 tablet (100 mg total) by mouth daily.  Dispense: 90 tablet; Refill: 1 - CMP14+EGFR  2. Postoperative hypothyroidism - levothyroxine (SYNTHROID, LEVOTHROID) 50 MCG tablet; Take 1 tablet (50 mcg total) by mouth daily.  Dispense: 90 tablet; Refill: 1 - Thyroid Panel With TSH  3. Mixed hyperlipidemia Low fat diet - simvastatin (ZOCOR) 40 MG  tablet; Take 1 tablet (40 mg total) by mouth at bedtime.  Dispense: 90 tablet; Refill: 1 - Lipid panel  4. GAD (generalized anxiety disorder) Stress management  5. Recurrent major depressive disorder, in full remission ([place]) - DULoxetine (CYMBALTA) 30 MG capsule; Take 1 capsule (30 mg total) by mouth daily.  Dispense: 90 capsule; Refill: 0  6. Morbid obesity ([place]) Discussed diet and exercise for person with BMI >25 Will recheck weight in 3-6 months  7. Screening mammogram, encounter for - MM SCREENING BREAST TOMO BILATERAL; Future    Labs pending Health maintenance reviewed Diet and exercise encouraged Continue all meds Follow up  In 6 months   [place], FNP

## 2017-04-05 LAB — CMP14+EGFR
ALK PHOS: 84 IU/L (ref 39–117)
ALT: 16 IU/L (ref 0–32)
AST: 16 IU/L (ref 0–40)
Albumin/Globulin Ratio: 1.7 (ref 1.2–2.2)
Albumin: 4.3 g/dL (ref 3.6–4.8)
BUN/Creatinine Ratio: 21 (ref 12–28)
BUN: 19 mg/dL (ref 8–27)
Bilirubin Total: 0.7 mg/dL (ref 0.0–1.2)
CALCIUM: 10.4 mg/dL — AB (ref 8.7–10.3)
CO2: 25 mmol/L (ref 20–29)
CREATININE: 0.92 mg/dL (ref 0.57–1.00)
Chloride: 102 mmol/L (ref 96–106)
GFR calc Af Amer: 76 mL/min/{1.73_m2} (ref >59)
GFR, EST NON AFRICAN AMERICAN: 66 mL/min/{1.73_m2} (ref >59)
GLUCOSE: 122 mg/dL — AB (ref 65–99)
Globulin, Total: 2.5 g/dL (ref 1.5–4.5)
Potassium: 5 mmol/L (ref 3.5–5.2)
SODIUM: 141 mmol/L (ref 134–144)
Total Protein: 6.8 g/dL (ref 6.0–8.5)

## 2017-04-05 LAB — LIPID PANEL
CHOLESTEROL TOTAL: 149 mg/dL (ref 100–199)
Chol/HDL Ratio: 2.1 ratio (ref 0.0–4.4)
HDL: 70 mg/dL (ref >39)
LDL Calculated: 55 mg/dL (ref 0–99)
TRIGLYCERIDES: 121 mg/dL (ref 0–149)
VLDL Cholesterol Cal: 24 mg/dL (ref 5–40)

## 2017-04-05 LAB — THYROID PANEL WITH TSH
FREE THYROXINE INDEX: 2.6 (ref 1.2–4.9)
T3 UPTAKE RATIO: 26 % (ref 24–39)
T4 TOTAL: 10.1 ug/dL (ref 4.5–12.0)
TSH: 0.629 u[IU]/mL (ref 0.450–4.500)

## 2017-04-08 ENCOUNTER — Ambulatory Visit: Payer: BLUE CROSS/BLUE SHIELD | Admitting: Nurse Practitioner

## 2017-04-25 ENCOUNTER — Other Ambulatory Visit: Payer: Self-pay | Admitting: Nurse Practitioner

## 2017-04-25 DIAGNOSIS — E782 Mixed hyperlipidemia: Secondary | ICD-10-CM

## 2017-04-25 DIAGNOSIS — E890 Postprocedural hypothyroidism: Secondary | ICD-10-CM

## 2017-05-12 ENCOUNTER — Other Ambulatory Visit: Payer: Self-pay | Admitting: Nurse Practitioner

## 2017-05-12 DIAGNOSIS — F3342 Major depressive disorder, recurrent, in full remission: Secondary | ICD-10-CM

## 2017-05-12 DIAGNOSIS — I10 Essential (primary) hypertension: Secondary | ICD-10-CM

## 2017-06-02 LAB — HM MAMMOGRAPHY

## 2017-07-05 ENCOUNTER — Other Ambulatory Visit: Payer: Self-pay | Admitting: Nurse Practitioner

## 2017-07-05 DIAGNOSIS — F3342 Major depressive disorder, recurrent, in full remission: Secondary | ICD-10-CM

## 2017-07-09 ENCOUNTER — Other Ambulatory Visit: Payer: Self-pay | Admitting: Nurse Practitioner

## 2017-07-09 DIAGNOSIS — F3342 Major depressive disorder, recurrent, in full remission: Secondary | ICD-10-CM

## 2017-07-09 NOTE — Telephone Encounter (Signed)
Last seen 04/04/17  MMM 

## 2017-08-07 ENCOUNTER — Encounter: Payer: Self-pay | Admitting: Nurse Practitioner

## 2017-08-22 ENCOUNTER — Ambulatory Visit (INDEPENDENT_AMBULATORY_CARE_PROVIDER_SITE_OTHER): Payer: Medicare Other

## 2017-08-22 ENCOUNTER — Ambulatory Visit: Payer: Medicare Other | Admitting: Nurse Practitioner

## 2017-08-22 ENCOUNTER — Encounter: Payer: Self-pay | Admitting: Nurse Practitioner

## 2017-08-22 VITALS — BP 139/70 | HR 66 | Temp 97.1°F | Ht 64.0 in | Wt 211.0 lb

## 2017-08-22 DIAGNOSIS — F411 Generalized anxiety disorder: Secondary | ICD-10-CM | POA: Diagnosis not present

## 2017-08-22 DIAGNOSIS — I10 Essential (primary) hypertension: Secondary | ICD-10-CM

## 2017-08-22 DIAGNOSIS — Z780 Asymptomatic menopausal state: Secondary | ICD-10-CM | POA: Diagnosis not present

## 2017-08-22 DIAGNOSIS — F3342 Major depressive disorder, recurrent, in full remission: Secondary | ICD-10-CM | POA: Diagnosis not present

## 2017-08-22 DIAGNOSIS — Z23 Encounter for immunization: Secondary | ICD-10-CM | POA: Diagnosis not present

## 2017-08-22 DIAGNOSIS — Z13820 Encounter for screening for osteoporosis: Secondary | ICD-10-CM | POA: Diagnosis not present

## 2017-08-22 DIAGNOSIS — E890 Postprocedural hypothyroidism: Secondary | ICD-10-CM

## 2017-08-22 DIAGNOSIS — E782 Mixed hyperlipidemia: Secondary | ICD-10-CM

## 2017-08-22 MED ORDER — DULOXETINE HCL 30 MG PO CPEP
30.0000 mg | ORAL_CAPSULE | Freq: Every day | ORAL | 1 refills | Status: DC
Start: 2017-08-22 — End: 2018-02-24

## 2017-08-22 MED ORDER — LEVOTHYROXINE SODIUM 50 MCG PO TABS
50.0000 ug | ORAL_TABLET | Freq: Every day | ORAL | 1 refills | Status: DC
Start: 2017-08-22 — End: 2018-02-24

## 2017-08-22 MED ORDER — LOSARTAN POTASSIUM 100 MG PO TABS: 100.0000 mg | ORAL_TABLET | Freq: Every day | ORAL | 1 refills | Status: DC

## 2017-08-22 NOTE — Progress Notes (Signed)
Subjective:    Patient ID: Linda Hernandez, female    DOB: [birth year], 67 y.o.   MRN: [id number]   Chief Complaint: Medical Management of Chronic Issues   HPI:  1. Essential hypertension  No c/o chest pain, sob or headache. Does not check blood pressure at home. BP Readings from Last 3 Encounters:  04/04/17 140/76  10/04/16 131/80  06/19/16 125/68     2. Postoperative hypothyroidism  Not having any thyroid problems that she is aware of. No c/o fatigue or heart palpitations   3. Morbid obesity (Linda Hernandez)  Weight down 2 lbs from last visit  4. Mixed hyperlipidemia  Patient does not watch diet and does no exercise.  5. GAD (generalized anxiety disorder)  Currently dong well. GAD 7 : Generalized Anxiety Score 08/22/2017  Nervous, Anxious, on Edge 0  Control/stop worrying 0  Worry too much - different things 0  Trouble relaxing 0  Restless 0  Easily annoyed or irritable 1  Afraid - awful might happen 0  Total GAD 7 Score 1  Anxiety Difficulty Not difficult at all      6. Recurrent major depressive disorder, in full remission (Linda Hernandez)  Currently on cymbalta and works well for her. Depression screen Linda Hernandez 2/9 08/22/2017 04/04/2017 10/04/2016  Decreased Interest 0 0 0  Down, Depressed, Hopeless 0 0 0  PHQ - 2 Score 0 0 0       Outpatient Encounter Medications as of 08/22/2017  Medication Sig  . Ascorbic Acid (VITAMIN C) 100 MG tablet Take 100 mg by mouth daily.  . Calcium Carbonate (CALTRATE 600 PO) Take by mouth.  . Cholecalciferol (VITAMIN D) 2000 units tablet Take 2,000 Units by mouth daily.   . cyanocobalamin 1000 MCG tablet Take 1,000 mcg by mouth daily.  . DULoxetine (CYMBALTA) 30 MG capsule TAKE 1 CAPSULE BY MOUTH  DAILY  . fish oil-omega-3 fatty acids 1000 MG capsule Take 2 g by mouth daily.  Marland Kitchen levothyroxine (SYNTHROID, LEVOTHROID) 50 MCG tablet Take 1 tablet (50 mcg total) by mouth daily.  Marland Kitchen losartan (COZAAR) 100 MG tablet TAKE 1 TABLET BY MOUTH  DAILY  . simvastatin  (ZOCOR) 40 MG tablet Take 1 tablet (40 mg total) by mouth at bedtime.       New complaints: None today  Social history: retired    Review of Systems  Constitutional: Negative for activity change and appetite change.  HENT: Negative.   Eyes: Negative for pain.  Respiratory: Negative for shortness of breath.   Cardiovascular: Negative for chest pain, palpitations and leg swelling.  Gastrointestinal: Negative for abdominal pain.  Endocrine: Negative for polydipsia.  Genitourinary: Negative.   Skin: Negative for rash.  Neurological: Negative for dizziness, weakness and headaches.  Hematological: Does not bruise/bleed easily.  Psychiatric/Behavioral: Negative.   All other systems reviewed and are negative.      Objective:   Physical Exam  Constitutional: She is oriented to person, place, and time. She appears well-developed and well-nourished.  HENT:  Head: Normocephalic.  Nose: Nose normal.  Mouth/Throat: Oropharynx is clear and moist.  Eyes: Pupils are equal, round, and reactive to light. EOM are normal.  Neck: Normal range of motion. Neck supple. No JVD present. Carotid bruit is not present.  Cardiovascular: Normal rate, regular rhythm, normal heart sounds and intact distal pulses.  Pulmonary/Chest: Effort normal and breath sounds normal. No respiratory distress. She has no wheezes. She has no rales. She exhibits no tenderness.  Abdominal: Soft. Normal appearance, normal aorta and  bowel sounds are normal. She exhibits no distension, no abdominal bruit, no pulsatile midline mass and no mass. There is no splenomegaly or hepatomegaly. There is no tenderness.  Musculoskeletal: Normal range of motion. She exhibits no edema.  Lymphadenopathy:    She has no cervical adenopathy.  Neurological: She is alert and oriented to person, place, and time. She has normal reflexes.  Skin: Skin is warm and dry.  Psychiatric: She has a normal mood and affect. Her behavior is normal. Judgment  and thought content normal.  Nursing note and vitals reviewed.   BP 139/70   Pulse 66   Temp (!) 97.1 F (36.2 C) (Oral)   Ht 5' 4" (1.626 m)   Wt 211 lb (95.7 kg)   LMP 06/01/1997   BMI 36.22 kg/m        Assessment & Plan:  Linda Hernandez comes in today with chief complaint of Medical Management of Chronic Issues   Diagnosis and orders addressed:  1. Essential hypertension Low sodium diet - losartan (COZAAR) 100 MG tablet; Take 1 tablet (100 mg total) by mouth daily.  Dispense: 90 tablet; Refill: 1 - CMP14+EGFR  2. Postoperative hypothyroidism - levothyroxine (SYNTHROID, LEVOTHROID) 50 MCG tablet; Take 1 tablet (50 mcg total) by mouth daily.  Dispense: 90 tablet; Refill: 1  3. Morbid obesity (Linda Hernandez) Discussed diet and exercise for person with BMI >25 Will recheck weight in 3-6 months  4. Mixed hyperlipidemia Low fat diet - Lipid panel  5. GAD (generalized anxiety disorder) Stress managment  6. Recurrent major depressive disorder, in full remission (Linda Hernandez) - DULoxetine (CYMBALTA) 30 MG capsule; Take 1 capsule (30 mg total) by mouth daily.  Dispense: 90 capsule; Refill: 1   Labs pending Health Maintenance reviewed Diet and exercise encouraged  Follow up plan: 6 months   [name] Done, FNP

## 2017-08-22 NOTE — Addendum Note (Signed)
Addended by: Rolena Infante on: 08/22/2017 12:31 PM   Modules accepted: Orders

## 2017-08-22 NOTE — Addendum Note (Signed)
Addended by: Chevis Pretty on: 08/22/2017 11:27 AM   Modules accepted: Orders

## 2017-08-22 NOTE — Patient Instructions (Signed)

## 2017-08-23 LAB — LIPID PANEL
CHOLESTEROL TOTAL: 160 mg/dL (ref 100–199)
Chol/HDL Ratio: 2.4 ratio (ref 0.0–4.4)
HDL: 68 mg/dL (ref >39)
LDL Calculated: 62 mg/dL (ref 0–99)
TRIGLYCERIDES: 148 mg/dL (ref 0–149)
VLDL Cholesterol Cal: 30 mg/dL (ref 5–40)

## 2017-08-23 LAB — CMP14+EGFR
A/G RATIO: 2 (ref 1.2–2.2)
ALT: 18 IU/L (ref 0–32)
AST: 18 IU/L (ref 0–40)
Albumin: 4.5 g/dL (ref 3.6–4.8)
Alkaline Phosphatase: 76 IU/L (ref 39–117)
BILIRUBIN TOTAL: 0.7 mg/dL (ref 0.0–1.2)
BUN/Creatinine Ratio: 14 (ref 12–28)
BUN: 14 mg/dL (ref 8–27)
CHLORIDE: 100 mmol/L (ref 96–106)
CO2: 27 mmol/L (ref 20–29)
Calcium: 10.9 mg/dL — ABNORMAL HIGH (ref 8.7–10.3)
Creatinine, Ser: 0.98 mg/dL (ref 0.57–1.00)
GFR calc Af Amer: 70 mL/min/{1.73_m2} (ref >59)
GFR calc non Af Amer: 61 mL/min/{1.73_m2} (ref >59)
GLUCOSE: 120 mg/dL — AB (ref 65–99)
Globulin, Total: 2.3 g/dL (ref 1.5–4.5)
POTASSIUM: 5 mmol/L (ref 3.5–5.2)
Sodium: 139 mmol/L (ref 134–144)
Total Protein: 6.8 g/dL (ref 6.0–8.5)

## 2017-09-01 ENCOUNTER — Encounter: Payer: Self-pay | Admitting: Nurse Practitioner

## 2017-09-01 DIAGNOSIS — M8580 Other specified disorders of bone density and structure, unspecified site: Secondary | ICD-10-CM | POA: Insufficient documentation

## 2017-09-15 ENCOUNTER — Ambulatory Visit (INDEPENDENT_AMBULATORY_CARE_PROVIDER_SITE_OTHER): Payer: Medicare Other | Admitting: Nurse Practitioner

## 2017-09-15 ENCOUNTER — Encounter: Payer: Self-pay | Admitting: Nurse Practitioner

## 2017-09-15 VITALS — BP 135/69 | HR 66 | Temp 96.9°F | Ht 64.0 in | Wt 211.0 lb

## 2017-09-15 DIAGNOSIS — Z0000 Encounter for general adult medical examination without abnormal findings: Secondary | ICD-10-CM | POA: Diagnosis not present

## 2017-09-15 NOTE — Progress Notes (Signed)
Subjective:    Linda Hernandez is a 67 y.o. female who presents for a Welcome to Medicare exam.   Review of Systems Normal- all negative        Objective:    Today's Vitals   09/15/17 0946  BP: 135/69  Pulse: 66  Temp: (!) 96.9 F (36.1 C)  TempSrc: Oral  Weight: 211 lb (95.7 kg)  Height: 5\' 4"  (1.626 m)  Body mass index is 36.22 kg/m.  Medications Outpatient Encounter Medications as of 09/15/2017  Medication Sig  . Ascorbic Acid (VITAMIN C) 100 MG tablet Take 100 mg by mouth daily.  . Calcium Carbonate (CALTRATE 600 PO) Take by mouth.  . Cholecalciferol (VITAMIN D) 2000 units tablet Take 2,000 Units by mouth daily.   . cyanocobalamin 1000 MCG tablet Take 1,000 mcg by mouth daily.  . DULoxetine (CYMBALTA) 30 MG capsule Take 1 capsule (30 mg total) by mouth daily.  . fish oil-omega-3 fatty acids 1000 MG capsule Take 2 g by mouth daily.  Marland Kitchen levothyroxine (SYNTHROID, LEVOTHROID) 50 MCG tablet Take 1 tablet (50 mcg total) by mouth daily.  Marland Kitchen losartan (COZAAR) 100 MG tablet Take 1 tablet (100 mg total) by mouth daily.  . simvastatin (ZOCOR) 40 MG tablet Take 1 tablet (40 mg total) by mouth at bedtime.   Facility-Administered Encounter Medications as of 09/15/2017  Medication  . 0.9 %  sodium chloride infusion     History: Past Medical History:  Diagnosis Date  . Anxiety   . Hyperlipidemia   . Hypertension   . Snoring   . Thyroid disease    thyroid tumor   Past Surgical History:  Procedure Laterality Date  . ABDOMINAL HYSTERECTOMY    . COLONOSCOPY    . DENTAL RESTORATION/EXTRACTION WITH X-RAY    . DILATION AND CURETTAGE OF UTERUS    . MOHS SURGERY    . 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 Diabetes Mother        pre diabetic  per patient  . Heart disease Father   . Diabetes Sister   . Down syndrome Sister   . Diabetes Brother   . Diabetes Brother   . Colon cancer Paternal Uncle     60's  . Colon cancer Paternal Grandmother        16's  . Esophageal cancer Neg Hx   . Rectal cancer Neg Hx   . Stomach cancer Neg Hx    Social History   Occupational History  . Occupation: retired  Tobacco Use  . Smoking status: Never Smoker  . Smokeless tobacco: Never Used  Substance and Sexual Activity  . Alcohol use: Yes    Comment: extremely rare 2 times yearly  . Drug use: No  . Sexual activity: Yes    Birth control/protection: Surgical    Tobacco Counseling Counseling given: Not Answered   Immunizations and Health Maintenance Immunization History  Administered Date(s) Administered  . Influenza,inj,Quad PF,6+ Mos 12/27/2013, 01/13/2015, 12/23/2016  . Influenza-Unspecified 12/13/2016  . Pneumococcal Conjugate-13 04/29/2016  . Pneumococcal Polysaccharide-23 08/22/2017  . Tdap 12/27/2013     Activities of Daily Living In your present state of health, do you have any difficulty performing the following activities: 09/15/2017  Hearing? N  Vision? N  Difficulty concentrating or making decisions? N  Walking or climbing stairs? N  Dressing or bathing? N  Doing errands,  shopping? N  Some recent data might be hidden    Physical Exam  No physical assesment doen today- see previous visit for physical assessment(optional), or other factors deemed appropriate based on the beneficiary's medical and social history and current clinical standards.  Advanced Directives:  discussed and will give information about it.    Assessment:    This is a routine wellness examination for this patient . yes  Vision/Hearing screen No prolmes  Dietary issues and exercise activities discussed:   would like to loose a few pounds over the last year. Would like to exercise but is not self motivated   Depression Screen   Depression screen [facility] 2/9 09/15/2017 09/15/2017 08/22/2017  Decreased Interest 0 0 0  Down, Depressed, Hopeless 0 0 0  PHQ - 2 Score 0 0 0     Fall Risk Fall Risk   09/15/2017 08/22/2017 04/04/2017 10/04/2016 04/29/2016  Falls in the past year? No No No No No     Cognitive Function: .mini mental exam is normal with score of 30    [id number] NSR  Patient Care Team: Chevis Pretty, FNP as PCP - General (Nurse Practitioner)     Plan:    welcome to medicare  I have personally reviewed and noted the following in the patient's chart:   . Medical and social history . Use of alcohol, tobacco or illicit drugs  . Current medications and supplements . Functional ability and status . Nutritional status . Physical activity . Advanced directives . List of other physicians . Hospitalizations, surgeries, and ER visits in previous 12 months . Vitals . Screenings to include cognitive, depression, and falls . Referrals and appointments  In addition, I have reviewed and discussed with patient certain preventive protocols, quality metrics, and best practice recommendations. A written personalized care plan for preventive services as well as general preventive health recommendations were provided to patient.     [place], [place] 09/15/2017

## 2017-09-15 NOTE — Addendum Note (Signed)
Addended by: Rolena Infante on: 09/15/2017 11:30 AM   Modules accepted: Orders

## 2017-09-15 NOTE — Patient Instructions (Signed)

## 2017-10-02 ENCOUNTER — Ambulatory Visit: Payer: Medicare Other | Admitting: Nurse Practitioner

## 2017-10-07 ENCOUNTER — Other Ambulatory Visit: Payer: Self-pay | Admitting: Nurse Practitioner

## 2017-10-07 DIAGNOSIS — E782 Mixed hyperlipidemia: Secondary | ICD-10-CM

## 2018-02-24 ENCOUNTER — Ambulatory Visit: Payer: Medicare Other | Admitting: Nurse Practitioner

## 2018-02-24 ENCOUNTER — Encounter: Payer: Self-pay | Admitting: Nurse Practitioner

## 2018-02-24 VITALS — BP 118/67 | HR 64 | Temp 97.3°F | Ht 64.0 in | Wt 210.0 lb

## 2018-02-24 DIAGNOSIS — Z23 Encounter for immunization: Secondary | ICD-10-CM

## 2018-02-24 DIAGNOSIS — M8588 Other specified disorders of bone density and structure, other site: Secondary | ICD-10-CM | POA: Diagnosis not present

## 2018-02-24 DIAGNOSIS — F411 Generalized anxiety disorder: Secondary | ICD-10-CM

## 2018-02-24 DIAGNOSIS — I10 Essential (primary) hypertension: Secondary | ICD-10-CM

## 2018-02-24 DIAGNOSIS — E890 Postprocedural hypothyroidism: Secondary | ICD-10-CM

## 2018-02-24 DIAGNOSIS — F3342 Major depressive disorder, recurrent, in full remission: Secondary | ICD-10-CM

## 2018-02-24 DIAGNOSIS — E782 Mixed hyperlipidemia: Secondary | ICD-10-CM

## 2018-02-24 MED ORDER — LOSARTAN POTASSIUM 100 MG PO TABS: 100.0000 mg | ORAL_TABLET | Freq: Every day | ORAL | 1 refills | Status: DC

## 2018-02-24 MED ORDER — SIMVASTATIN 40 MG PO TABS: ORAL_TABLET | ORAL | 1 refills | Status: DC

## 2018-02-24 MED ORDER — DULOXETINE HCL 30 MG PO CPEP: 30.0000 mg | ORAL_CAPSULE | Freq: Every day | ORAL | 1 refills | Status: DC

## 2018-02-24 MED ORDER — LEVOTHYROXINE SODIUM 50 MCG PO TABS: 50.0000 ug | ORAL_TABLET | Freq: Every day | ORAL | 1 refills | Status: DC

## 2018-02-24 NOTE — Addendum Note (Signed)
Addended by: Chevis Pretty on: 02/24/2018 10:42 AM   Modules accepted: Orders

## 2018-02-24 NOTE — Addendum Note (Signed)
Addended by: Rolena Infante on: 02/24/2018 11:33 AM   Modules accepted: Orders

## 2018-02-24 NOTE — Patient Instructions (Signed)
Stress and Stress Management Stress is a normal reaction to life events. It is what you feel when life demands more than you are used to or more than you can handle. Some stress can be useful. For example, the stress reaction can help you catch the last bus of the day, study for a test, or meet a deadline at work. But stress that occurs too often or for too long can cause problems. It can affect your emotional health and interfere with relationships and normal daily activities. Too much stress can weaken your immune system and increase your risk for physical illness. If you already have a medical problem, stress can make it worse. What are the causes? All sorts of life events may cause stress. An event that causes stress for one person may not be stressful for another person. Major life events commonly cause stress. These may be positive or negative. Examples include losing your job, moving into a new home, getting married, having a baby, or losing a loved one. Less obvious life events may also cause stress, especially if they occur day after day or in combination. Examples include working long hours, driving in traffic, caring for children, being in debt, or being in a difficult relationship. What are the signs or symptoms? Stress may cause emotional symptoms including, the following:  Anxiety. This is feeling worried, afraid, on edge, overwhelmed, or out of control.  Anger. This is feeling irritated or impatient.  Depression. This is feeling sad, down, helpless, or guilty.  Difficulty focusing, remembering, or making decisions.  Stress may cause physical symptoms, including the following:  Aches and pains. These may affect your head, neck, back, stomach, or other areas of your body.  Tight muscles or clenched jaw.  Low energy or trouble sleeping.  Stress may cause unhealthy behaviors, including the following:  Eating to feel better (overeating) or skipping meals.  Sleeping too little,  too much, or both.  Working too much or putting off tasks (procrastination).  Smoking, drinking alcohol, or using drugs to feel better.  How is this diagnosed? Stress is diagnosed through an assessment by your health care provider. Your health care provider will ask questions about your symptoms and any stressful life events.Your health care provider will also ask about your medical history and may order blood tests or other tests. Certain medical conditions and medicine can cause physical symptoms similar to stress. Mental illness can cause emotional symptoms and unhealthy behaviors similar to stress. Your health care provider may refer you to a mental health professional for further evaluation. How is this treated? Stress management is the recommended treatment for stress.The goals of stress management are reducing stressful life events and coping with stress in healthy ways. Techniques for reducing stressful life events include the following:  Stress identification. Self-monitor for stress and identify what causes stress for you. These skills may help you to avoid some stressful events.  Time management. Set your priorities, keep a calendar of events, and learn to say "no." These tools can help you avoid making too many commitments.  Techniques for coping with stress include the following:  Rethinking the problem. Try to think realistically about stressful events rather than ignoring them or overreacting. Try to find the positives in a stressful situation rather than focusing on the negatives.  Exercise. Physical exercise can release both physical and emotional tension. The key is to find a form of exercise you enjoy and do it regularly.  Relaxation techniques. These relax the body and  mind. Examples include yoga, meditation, tai chi, biofeedback, deep breathing, progressive muscle relaxation, listening to music, being out in nature, journaling, and other hobbies. Again, the key is to find  one or more that you enjoy and can do regularly.  Healthy lifestyle. Eat a balanced diet, get plenty of sleep, and do not smoke. Avoid using alcohol or drugs to relax.  Strong support network. Spend time with family, friends, or other people you enjoy being around.Express your feelings and talk things over with someone you trust.  Counseling or talktherapy with a mental health professional may be helpful if you are having difficulty managing stress on your own. Medicine is typically not recommended for the treatment of stress.Talk to your health care provider if you think you need medicine for symptoms of stress. Follow these instructions at home:  Keep all follow-up visits as directed by your health care provider.  Take all medicines as directed by your health care provider. Contact a health care provider if:  Your symptoms get worse or you start having new symptoms.  You feel overwhelmed by your problems and can no longer manage them on your own. Get help right away if:  You feel like hurting yourself or someone else. This information is not intended to replace advice given to you by your health care provider. Make sure you discuss any questions you have with your health care provider. Document Released: 08/21/2000 Document Revised: 08/03/2015 Document Reviewed: 10/20/2012 Elsevier Interactive Patient Education  2017 [employer]  

## 2018-02-24 NOTE — Progress Notes (Signed)
Subjective:    Patient ID: Linda Hernandez, female    DOB: Dec 14, 1950, 67 y.o.   MRN: [id number]   Chief Complaint: Medical Management of Chronic Issues   HPI:  1. Essential hypertension  No c/o chest pain, sob or headache. Does not check blood pressure at h ome. BP Readings from Last 3 Encounters:  09/15/17 135/69  08/22/17 139/70  04/04/17 140/76     2. Postoperative hypothyroidism  Not having any problems that she is aware of  3. Osteopenia of lumbar spine  Last dexascan was done 08/27/17 with t score of -1.8. takes daily vitamin d and calcium supplement  4. Morbid obesity ([place])  Weight is unchanged form previous visit  5. Mixed hyperlipidemia  Does not really watch diet  6. GAD (generalized anxiety disorder)  is currently on no rx meds. Says she is doing quite well  7. Recurrent major depressive disorder, in full remission ([place])  She is on cymbalta and is working well for her. Depression screen [facility] 2/9 02/24/2018 09/15/2017 09/15/2017  Decreased Interest 0 0 0  Down, Depressed, Hopeless 0 0 0  PHQ - 2 Score 0 0 0       Outpatient Encounter Medications as of 02/24/2018  Medication Sig  . Ascorbic Acid (VITAMIN C) 100 MG tablet Take 100 mg by mouth daily.  . Calcium Carbonate (CALTRATE 600 PO) Take by mouth.  . Cholecalciferol (VITAMIN D) 2000 units tablet Take 2,000 Units by mouth daily.   . cyanocobalamin 1000 MCG tablet Take 1,000 mcg by mouth daily.  . DULoxetine (CYMBALTA) 30 MG capsule Take 1 capsule (30 mg total) by mouth daily.  . fish oil-omega-3 fatty acids 1000 MG capsule Take 2 g by mouth daily.  Marland Kitchen levothyroxine (SYNTHROID, LEVOTHROID) 50 MCG tablet Take 1 tablet (50 mcg total) by mouth daily.  Marland Kitchen losartan (COZAAR) 100 MG tablet Take 1 tablet (100 mg total) by mouth daily.  . simvastatin (ZOCOR) 40 MG tablet TAKE 1 TABLET BY MOUTH EVERYDAY AT BEDTIME      New complaints: None today  Social history: Lives with husband if 31 years. They are both very  active   Review of Systems  Constitutional: Negative for activity change and appetite change.  HENT: Negative.   Eyes: Negative for pain.  Respiratory: Negative for shortness of breath.   Cardiovascular: Negative for chest pain, palpitations and leg swelling.  Gastrointestinal: Negative for abdominal pain.  Endocrine: Negative for polydipsia.  Genitourinary: Negative.   Skin: Negative for rash.  Neurological: Negative for dizziness, weakness and headaches.  Hematological: Does not bruise/bleed easily.  Psychiatric/Behavioral: Negative.   All other systems reviewed and are negative.      Objective:   Physical Exam Vitals signs and nursing note reviewed.  Constitutional:      General: She is not in acute distress.    Appearance: Normal appearance. She is well-developed.  HENT:     Head: Normocephalic.     Nose: Nose normal.  Eyes:     Pupils: Pupils are equal, round, and reactive to light.  Neck:     Musculoskeletal: Normal range of motion and neck supple.     Vascular: No carotid bruit or JVD.  Cardiovascular:     Rate and Rhythm: Normal rate and regular rhythm.     Heart sounds: Normal heart sounds.  Pulmonary:     Effort: Pulmonary effort is normal. No respiratory distress.     Breath sounds: Normal breath sounds. No wheezing or rales.  Chest:     Chest wall: No tenderness.  Abdominal:     General: Bowel sounds are normal. There is no distension or abdominal bruit.     Palpations: Abdomen is soft. There is no hepatomegaly, splenomegaly, mass or pulsatile mass.     Tenderness: There is no abdominal tenderness.  Musculoskeletal: Normal range of motion.     Right lower leg: Edema (1+) present.     Left lower leg: Edema (1+) present.  Lymphadenopathy:     Cervical: No cervical adenopathy.  Skin:    General: Skin is warm and dry.  Neurological:     Mental Status: She is alert and oriented to person, place, and time.     Deep Tendon Reflexes: Reflexes are normal and  symmetric.  Psychiatric:        Behavior: Behavior normal.        Thought Content: Thought content normal.        Judgment: Judgment normal.     BP 118/67   Pulse 64   Temp (!) 97.3 F (36.3 C) (Oral)   Ht 5\' 4"  (1.626 m)   Wt 210 lb (95.3 kg)   LMP 06/01/1997   BMI 36.05 kg/m        Assessment & Plan:  [name] comes in today with chief complaint of Medical Management of Chronic Issues   Diagnosis and orders addressed:  1. Essential hypertension Low sodium diet - losartan (COZAAR) 100 MG tablet; Take 1 tablet (100 mg total) by mouth daily.  Dispense: 90 tablet; Refill: 1  2. Postoperative hypothyroidism - levothyroxine (SYNTHROID, LEVOTHROID) 50 MCG tablet; Take 1 tablet (50 mcg total) by mouth daily.  Dispense: 90 tablet; Refill: 1  3. Osteopenia of lumbar spine Weight bearing exercise  4. Morbid obesity ([place]) Discussed diet and exercise for person with BMI >25 Will recheck weight in 3-6 months  5. Mixed hyperlipidemia Low fat diet - simvastatin (ZOCOR) 40 MG tablet; TAKE 1 TABLET BY MOUTH EVERYDAY AT BEDTIME  Dispense: 90 tablet; Refill: 1  6. GAD (generalized anxiety disorder) Stress management  7. Recurrent major depressive disorder, in full remission ([place]) - DULoxetine (CYMBALTA) 30 MG capsule; Take 1 capsule (30 mg total) by mouth daily.  Dispense: 90 capsule; Refill: 1   Labs pending Health Maintenance reviewed Diet and exercise encouraged  Follow up plan: 6 months   [name] Done, FNP

## 2018-02-25 LAB — CMP14+EGFR
A/G RATIO: 1.7 (ref 1.2–2.2)
ALK PHOS: 79 IU/L (ref 39–117)
ALT: 12 IU/L (ref 0–32)
AST: 16 IU/L (ref 0–40)
Albumin: 4.3 g/dL (ref 3.6–4.8)
BUN/Creatinine Ratio: 16 (ref 12–28)
BUN: 15 mg/dL (ref 8–27)
Bilirubin Total: 0.7 mg/dL (ref 0.0–1.2)
CO2: 23 mmol/L (ref 20–29)
Calcium: 10.3 mg/dL (ref 8.7–10.3)
Chloride: 102 mmol/L (ref 96–106)
Creatinine, Ser: 0.91 mg/dL (ref 0.57–1.00)
GFR calc Af Amer: 77 mL/min/{1.73_m2} (ref >59)
GFR calc non Af Amer: 66 mL/min/{1.73_m2} (ref >59)
Globulin, Total: 2.5 g/dL (ref 1.5–4.5)
Glucose: 122 mg/dL — ABNORMAL HIGH (ref 65–99)
POTASSIUM: 4.7 mmol/L (ref 3.5–5.2)
Sodium: 140 mmol/L (ref 134–144)
Total Protein: 6.8 g/dL (ref 6.0–8.5)

## 2018-02-25 LAB — LIPID PANEL
Chol/HDL Ratio: 2.4 ratio (ref 0.0–4.4)
Cholesterol, Total: 164 mg/dL (ref 100–199)
HDL: 69 mg/dL (ref >39)
LDL Calculated: 64 mg/dL (ref 0–99)
Triglycerides: 155 mg/dL — ABNORMAL HIGH (ref 0–149)
VLDL Cholesterol Cal: 31 mg/dL (ref 5–40)

## 2018-07-29 ENCOUNTER — Telehealth: Payer: Self-pay | Admitting: Nurse Practitioner

## 2018-09-18 ENCOUNTER — Ambulatory Visit (INDEPENDENT_AMBULATORY_CARE_PROVIDER_SITE_OTHER): Payer: Medicare Other | Admitting: *Deleted

## 2018-09-18 ENCOUNTER — Other Ambulatory Visit: Payer: Self-pay

## 2018-09-18 ENCOUNTER — Encounter: Payer: Self-pay | Admitting: *Deleted

## 2018-09-18 DIAGNOSIS — Z0000 Encounter for general adult medical examination without abnormal findings: Secondary | ICD-10-CM

## 2018-09-18 NOTE — Progress Notes (Addendum)
MEDICARE ANNUAL WELLNESS VISIT  09/18/2018  Telephone Visit Disclaimer This Medicare AWV was conducted by telephone due to national recommendations for restrictions regarding the COVID-19 Pandemic (e.g. social distancing).  I verified, using two identifiers, that I am speaking with Linda Hernandez or their authorized healthcare agent. I discussed the limitations, risks, security, and privacy concerns of performing an evaluation and management service by telephone and the potential availability of an in-person appointment in the future. The patient expressed understanding and agreed to proceed.   Subjective:  Linda Hernandez is a 68 y.o. female patient of Linda Hernandez, [place] who had a Medicare Annual Wellness Visit today via telephone. Alani is Retired and lives with their spouse. she has 3 children. she reports that she is socially active and does interact with friends/family regularly. she is minimally physically active and enjoys gardening and putting jigsaw puzzles together.  Patient Care Team: Linda Pretty, FNP as PCP - General (Nurse Practitioner)  Advanced Directives 09/18/2018 06/19/2016 06/05/2016  Does Patient Have a Medical Advance Directive? No No No  Would patient like information on creating a medical advance directive? No - Patient declined [facility] Utilization Over the Past 12 Months: # of hospitalizations or ER visits: 0 # of surgeries: 0  Review of Systems    Patient reports that her overall health is unchanged compared to last year.  Patient Reported Readings (BP, Pulse, CBG, Weight, etc) none  Review of Systems: No complaints  All other systems negative.  Pain Assessment Pain : No/denies pain     Current Medications & Allergies (verified) Allergies as of 09/18/2018      Reactions   Penicillins Rash      Medication List       Accurate as of September 18, 2018  2:14 PM. If you have any questions, ask your nurse or doctor.         CALTRATE 600 PO Take by mouth.   cyanocobalamin 1000 MCG tablet Take 1,000 mcg by mouth daily.   DULoxetine 30 MG capsule Commonly known as: CYMBALTA Take 1 capsule (30 mg total) by mouth daily.   fish oil-omega-3 fatty acids 1000 MG capsule Take 2 g by mouth daily.   levothyroxine 50 MCG tablet Commonly known as: SYNTHROID Take 1 tablet (50 mcg total) by mouth daily.   losartan 100 MG tablet Commonly known as: COZAAR Take 1 tablet (100 mg total) by mouth daily.   simvastatin 40 MG tablet Commonly known as: ZOCOR TAKE 1 TABLET BY MOUTH EVERYDAY AT BEDTIME   vitamin C 100 MG tablet Take 100 mg by mouth daily.   Vitamin D 50 MCG (2000 UT) tablet Take 2,000 Units by mouth daily.       History (reviewed): Past Medical History:  Diagnosis Date  . Anxiety   . Hyperlipidemia   . Hypertension   . Snoring   . Thyroid disease    thyroid tumor   Past Surgical History:  Procedure Laterality Date  . ABDOMINAL HYSTERECTOMY    . COLONOSCOPY    . DENTAL RESTORATION/EXTRACTION WITH X-RAY    . DILATION AND CURETTAGE OF UTERUS    . MOHS SURGERY    . 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 Diabetes Mother        pre diabetic  per patient  . Heart disease Father   .  Diabetes Sister   . Down syndrome Sister   . Diabetes Brother   . Diabetes Brother   . Colon cancer Paternal Uncle        73's  . Colon cancer Paternal Grandmother        39's  . Esophageal cancer Neg Hx   . Rectal cancer Neg Hx   . Stomach cancer Neg Hx    Social History   Socioeconomic History  . Marital status: Married    Spouse name: Elenore Rota  . Number of children: 3  . Years of education: 92  . Highest education level: High school graduate  Occupational History  . Occupation: retired  Scientific laboratory technician  . Financial resource strain: Not hard at all  . Food insecurity    Worry: Never true    Inability: Never true  .  Transportation needs    Medical: No    Non-medical: No  Tobacco Use  . Smoking status: Never Smoker  . Smokeless tobacco: Never Used  Substance and Sexual Activity  . Alcohol use: Yes    Comment: extremely rare 2 times yearly  . Drug use: No  . Sexual activity: Yes    Birth control/protection: Surgical  Lifestyle  . Physical activity    Days per week: 0 days    Minutes per session: 0 min  . Stress: Not at all  Relationships  . Social connections    Talks on phone: More than three times a week    Gets together: Twice a week    Attends religious service: More than 4 times per year    Active member of club or organization: Yes    Attends meetings of clubs or organizations: 1 to 4 times per year    Relationship status: Married  Other Topics Concern  . Not on file  Social History Narrative  . Not on file    Activities of Daily Living In your present state of health, do you have any difficulty performing the following activities: 09/18/2018  Hearing? N  Vision? N  Difficulty concentrating or making decisions? N  Walking or climbing stairs? N  Dressing or bathing? N  Doing errands, shopping? N  Preparing Food and eating ? N  Using the Toilet? N  In the past six months, have you accidently leaked urine? N  Do you have problems with loss of bowel control? N  Managing your Medications? N  Managing your Finances? N  Housekeeping or managing your Housekeeping? N  Some recent data might be hidden    Patient Literacy How often do you need to have someone help you when you read instructions, pamphlets, or other written materials from your doctor or pharmacy?: 1 - Never What is the last grade level you completed in school?: 12th grade  Exercise Current Exercise Habits: The patient does not participate in regular exercise at present, Exercise limited by: None identified  Diet Patient reports consuming 2 meals a day and 1 snack(s) a day Patient reports that her primary diet is:  Regular Patient reports that she does have regular access to food.   Depression Screen PHQ 2/9 Scores 09/18/2018 02/24/2018 09/15/2017 09/15/2017 08/22/2017 04/04/2017 10/04/2016  PHQ - 2 Score 0 0 0 0 0 0 0     Fall Risk Fall Risk  09/18/2018 02/24/2018 09/15/2017 09/15/2017 08/22/2017  Falls in the past year? 0 0 No No No     Objective:  Embree A Holberg seemed alert and oriented and she participated appropriately during our  telephone visit.  Blood Pressure Weight BMI  BP Readings from Last 3 Encounters:  02/24/18 118/67  09/15/17 135/69  08/22/17 139/70   Wt Readings from Last 3 Encounters:  02/24/18 210 lb (95.3 kg)  09/15/17 211 lb (95.7 kg)  08/22/17 211 lb (95.7 kg)   BMI Readings from Last 1 Encounters:  02/24/18 36.05 kg/m    *Unable to obtain current vital signs, weight, and BMI due to telephone visit type  Hearing/Vision  . Kariss did not seem to have difficulty with hearing/understanding during the telephone conversation . Reports that she has not had a formal eye exam by an eye care professional within the past year . Reports that she has not had a formal hearing evaluation within the past year *Unable to fully assess hearing and vision during telephone visit type  Cognitive Function: 6CIT Screen 09/18/2018  What Year? 0 points  What month? 0 points  What time? 0 points  Count back from 20 2 points  Months in reverse 0 points  Repeat phrase 2 points  Total Score 4   (Normal:0-7, Significant for Dysfunction: >8)  Normal Cognitive Function Screening: Yes   Immunization & Health Maintenance Record Immunization History  Administered Date(s) Administered  . Influenza,inj,Quad PF,6+ Mos 12/27/2013, 01/13/2015, 12/23/2016  . Influenza-Unspecified 12/13/2016  . Pneumococcal Conjugate-13 04/29/2016  . Pneumococcal Polysaccharide-23 08/22/2017  . Tdap 12/27/2013  . Zoster Recombinat (Shingrix) 09/15/2017, 02/24/2018    Health Maintenance  Topic Date Due  . INFLUENZA  VACCINE  10/10/2018  . MAMMOGRAM  06/03/2019  . COLONOSCOPY  06/19/2021  . TETANUS/TDAP  12/28/2023  . DEXA SCAN  Completed  . Hepatitis C Screening  Completed  . PNA vac Low Risk Adult  Completed       Assessment  This is a routine wellness examination for Ixel A Kanno.  Health Maintenance: Due or Overdue There are no preventive care reminders to display for this patient.  Breeana A Strohecker does not need a referral for Community Assistance: Care Management:   no Social Work:    no Prescription Assistance:  no Nutrition/Diabetes Education:  no   Plan:  Personalized Goals Goals Addressed            This Visit's Progress   . DIET - INCREASE WATER INTAKE       Try to drink 6-8 glasses of water daily.      Personalized Health Maintenance & Screening Recommendations  Advanced directives: has NO advanced directive - not interested in additional information  Lung Cancer Screening Recommended: no (Low Dose CT Chest recommended if Age 30-80 years, 30 pack-year currently smoking OR have quit w/in past 15 years) Hepatitis C Screening recommended: no HIV Screening recommended: no  Advanced Directives: Written information was not prepared per patient's request.  Referrals & Orders No orders of the defined types were placed in this encounter.   Follow-up Plan . Follow-up with Linda Pretty, FNP as planned   I have personally reviewed and noted the following in the patient's chart:   . Medical and social history . Use of alcohol, tobacco or illicit drugs  . Current medications and supplements . Functional ability and status . Nutritional status . Physical activity . Advanced directives . List of other physicians . Hospitalizations, surgeries, and ER visits in previous 12 months . Vitals . Screenings to include cognitive, depression, and falls . Referrals and appointments  In addition, I have reviewed and discussed with Allye A Diggs certain preventive protocols,  quality metrics, and  best practice recommendations. A written personalized care plan for preventive services as well as general preventive health recommendations is available and can be mailed to the patient at her request.      Marylin Crosby, LPN  3/83/2919   I have reviewed and agree with the above AWV documentation.   [name] Done, FNP

## 2018-09-18 NOTE — Patient Instructions (Signed)
Preventive Care 66 Years and Older, Female Preventive care refers to lifestyle choices and visits with your health care provider that can promote health and wellness. This includes:  A yearly physical exam. This is also called an annual well check.  Regular dental and eye exams.  Immunizations.  Screening for certain conditions.  Healthy lifestyle choices, such as diet and exercise. What can I expect for my preventive care visit? Physical exam Your health care provider will check:  Height and weight. These may be used to calculate body mass index (BMI), which is a measurement that tells if you are at a healthy weight.  Heart rate and blood pressure.  Your skin for abnormal spots. Counseling Your health care provider may ask you questions about:  Alcohol, tobacco, and drug use.  Emotional well-being.  Home and relationship well-being.  Sexual activity.  Eating habits.  History of falls.  Memory and ability to understand (cognition).  Work and work Statistician.  Pregnancy and menstrual history. What immunizations do I need?  Influenza (flu) vaccine  This is recommended every year. Tetanus, diphtheria, and pertussis (Tdap) vaccine  You may need a Td booster every 10 years. Varicella (chickenpox) vaccine  You may need this vaccine if you have not already been vaccinated. Zoster (shingles) vaccine  You may need this after age 66. Pneumococcal conjugate (PCV13) vaccine  One dose is recommended after age 66. Pneumococcal polysaccharide (PPSV23) vaccine  One dose is recommended after age 66. Measles, mumps, and rubella (MMR) vaccine  You may need at least one dose of MMR if you were born in 1957 or later. You may also need a second dose. Meningococcal conjugate (MenACWY) vaccine  You may need this if you have certain conditions. Hepatitis A vaccine  You may need this if you have certain conditions or if you travel or work in places where you may be exposed  to hepatitis A. Hepatitis B vaccine  You may need this if you have certain conditions or if you travel or work in places where you may be exposed to hepatitis B. Haemophilus influenzae type b (Hib) vaccine  You may need this if you have certain conditions. You may receive vaccines as individual doses or as more than one vaccine together in one shot (combination vaccines). Talk with your health care provider about the risks and benefits of combination vaccines. What tests do I need? Blood tests  Lipid and cholesterol levels. These may be checked every 5 years, or more frequently depending on your overall health.  Hepatitis C test.  Hepatitis B test. Screening  Lung cancer screening. You may have this screening every year starting at age 66 if you have a 30-pack-year history of smoking and currently smoke or have quit within the past 15 years.  Colorectal cancer screening. All adults should have this screening starting at age 66 and continuing until age 66. Your health care provider may recommend screening at age 66 if you are at increased risk. You will have tests every 1-10 years, depending on your results and the type of screening test.  Diabetes screening. This is done by checking your blood sugar (glucose) after you have not eaten for a while (fasting). You may have this done every 1-3 years.  Mammogram. This may be done every 1-2 years. Talk with your health care provider about how often you should have regular mammograms.  BRCA-related cancer screening. This may be done if you have a family history of breast, ovarian, tubal, or peritoneal cancers.  Other tests  Sexually transmitted disease (STD) testing.  Bone density scan. This is done to screen for osteoporosis. You may have this done starting at age 66. Follow these instructions at home: Eating and drinking  Eat a diet that includes fresh fruits and vegetables, whole grains, lean protein, and low-fat dairy products. Limit  your intake of foods with high amounts of sugar, saturated fats, and salt.  Take vitamin and mineral supplements as recommended by your health care provider.  Do not drink alcohol if your health care provider tells you not to drink.  If you drink alcohol: ? Limit how much you have to 0-1 drink a day. ? Be aware of how much alcohol is in your drink. In the [place] one drink equals one 12 oz bottle of beer (355 mL), one 5 oz glass of wine (148 mL), or one 1 oz glass of hard liquor (44 mL). Lifestyle  Take daily care of your teeth and gums.  Stay active. Exercise for at least 30 minutes on 5 or more days each week.  Do not use any products that contain nicotine or tobacco, such as cigarettes, e-cigarettes, and chewing tobacco. If you need help quitting, ask your health care provider.  If you are sexually active, practice safe sex. Use a condom or other form of protection in order to prevent STIs (sexually transmitted infections).  Talk with your health care provider about taking a low-dose aspirin or statin. What's next?  Go to your health care provider once a year for a well check visit.  Ask your health care provider how often you should have your eyes and teeth checked.  Stay up to date on all vaccines. This information is not intended to replace advice given to you by your health care provider. Make sure you discuss any questions you have with your health care provider. Document Released: 03/24/2015 Document Revised: 02/19/2018 Document Reviewed: 02/19/2018 Elsevier Patient Education  2020 [employer].

## 2018-10-04 ENCOUNTER — Other Ambulatory Visit: Payer: Self-pay | Admitting: Nurse Practitioner

## 2018-10-04 DIAGNOSIS — E782 Mixed hyperlipidemia: Secondary | ICD-10-CM

## 2018-10-04 DIAGNOSIS — I10 Essential (primary) hypertension: Secondary | ICD-10-CM

## 2018-10-04 DIAGNOSIS — E890 Postprocedural hypothyroidism: Secondary | ICD-10-CM

## 2018-10-05 ENCOUNTER — Other Ambulatory Visit: Payer: Self-pay | Admitting: Nurse Practitioner

## 2018-10-05 DIAGNOSIS — F3342 Major depressive disorder, recurrent, in full remission: Secondary | ICD-10-CM

## 2018-11-02 ENCOUNTER — Other Ambulatory Visit: Payer: Self-pay | Admitting: Nurse Practitioner

## 2018-11-02 DIAGNOSIS — E890 Postprocedural hypothyroidism: Secondary | ICD-10-CM

## 2018-11-02 DIAGNOSIS — E782 Mixed hyperlipidemia: Secondary | ICD-10-CM

## 2018-11-02 DIAGNOSIS — F3342 Major depressive disorder, recurrent, in full remission: Secondary | ICD-10-CM

## 2018-11-17 ENCOUNTER — Encounter: Payer: Self-pay | Admitting: Nurse Practitioner

## 2018-11-17 ENCOUNTER — Ambulatory Visit: Payer: Medicare Other | Admitting: Nurse Practitioner

## 2018-11-17 ENCOUNTER — Other Ambulatory Visit: Payer: Self-pay

## 2018-11-17 VITALS — BP 142/75 | HR 62 | Temp 97.1°F | Ht 64.0 in | Wt 212.2 lb

## 2018-11-17 DIAGNOSIS — M8588 Other specified disorders of bone density and structure, other site: Secondary | ICD-10-CM

## 2018-11-17 DIAGNOSIS — I10 Essential (primary) hypertension: Secondary | ICD-10-CM | POA: Diagnosis not present

## 2018-11-17 DIAGNOSIS — Z23 Encounter for immunization: Secondary | ICD-10-CM | POA: Diagnosis not present

## 2018-11-17 DIAGNOSIS — E782 Mixed hyperlipidemia: Secondary | ICD-10-CM | POA: Diagnosis not present

## 2018-11-17 DIAGNOSIS — E890 Postprocedural hypothyroidism: Secondary | ICD-10-CM | POA: Diagnosis not present

## 2018-11-17 DIAGNOSIS — F411 Generalized anxiety disorder: Secondary | ICD-10-CM

## 2018-11-17 DIAGNOSIS — F3342 Major depressive disorder, recurrent, in full remission: Secondary | ICD-10-CM

## 2018-11-17 MED ORDER — LOSARTAN POTASSIUM 100 MG PO TABS: 100.0000 mg | ORAL_TABLET | Freq: Every day | ORAL | 0 refills | Status: DC

## 2018-11-17 MED ORDER — DULOXETINE HCL 30 MG PO CPEP: 30.0000 mg | ORAL_CAPSULE | Freq: Every day | ORAL | 0 refills | Status: DC

## 2018-11-17 MED ORDER — SIMVASTATIN 40 MG PO TABS: ORAL_TABLET | ORAL | 0 refills | Status: DC

## 2018-11-17 MED ORDER — LEVOTHYROXINE SODIUM 50 MCG PO TABS: 50.0000 ug | ORAL_TABLET | Freq: Every day | ORAL | 0 refills | Status: DC

## 2018-11-17 NOTE — Patient Instructions (Signed)

## 2018-11-17 NOTE — Progress Notes (Signed)
Subjective:    Patient ID: Linda Hernandez, female    DOB: 1950-03-13, 68 y.o.   MRN: [id number]   Chief Complaint: Medical Management of Chronic Issues    HPI:  1. Essential hypertension No c/o chest pain, sob or headache. Does no check bloodpressure at home. BP Readings from Last 3 Encounters:  11/17/18 (!) 155/76  02/24/18 118/67  09/15/17 135/69     2. Mixed hyperlipidemia Does not really watch diet and does o exercsie. Lab Results  Component Value Date   CHOL 164 02/24/2018   HDL 69 02/24/2018   LDLCALC 64 02/24/2018   TRIG 155 (H) 02/24/2018   CHOLHDL 2.4 02/24/2018     3. Postoperative hypothyroidism Is on daily synthroid.  Lab Results  Component Value Date   TSH 0.629 04/04/2017     4. Recurrent major depressive disorder, in full remission ([place]) Is currently doing well.  Is on cymbalta daily Depression screen [facility] 2/9 11/17/2018 09/18/2018 02/24/2018  Decreased Interest 0 0 0  Down, Depressed, Hopeless 0 0 0  PHQ - 2 Score 0 0 0  Altered sleeping 0 - -  Tired, decreased energy 0 - -  Change in appetite 0 - -  Feeling bad or failure about yourself  0 - -  Trouble concentrating 0 - -  Moving slowly or fidgety/restless 0 - -  Suicidal thoughts 0 - -  PHQ-9 Score 0 - -     5. GAD (generalized anxiety disorder) On no rx meds. Says she is more bored then anxious. GAD 7 : Generalized Anxiety Score 08/22/2017  Nervous, Anxious, on Edge 0  Control/stop worrying 0  Worry too much - different things 0  Trouble relaxing 0  Restless 0  Easily annoyed or irritable 1  Afraid - awful might happen 0  Total GAD 7 Score 1  Anxiety Difficulty Not difficult at all      6. Osteopenia of lumbar spine Does no weight bearing exercises. last dexascan was done 08/22/17 with t score of -1.8  7. Morbid obesity ([place]) No recent weight changes    Outpatient Encounter Medications as of 11/17/2018  Medication Sig  . Ascorbic Acid (VITAMIN C) 100 MG tablet Take 100 mg by  mouth daily.  . Calcium Carbonate (CALTRATE 600 PO) Take by mouth.  . Cholecalciferol (VITAMIN D) 2000 units tablet Take 2,000 Units by mouth daily.   . cyanocobalamin 1000 MCG tablet Take 1,000 mcg by mouth daily.  . DULoxetine (CYMBALTA) 30 MG capsule Take 1 capsule (30 mg total) by mouth daily. (Needs to be seen for 6 mos ckup)  . fish oil-omega-3 fatty acids 1000 MG capsule Take 2 g by mouth daily.  Marland Kitchen levothyroxine (SYNTHROID) 50 MCG tablet Take 1 tablet (50 mcg total) by mouth daily. (Needs labwork  . losartan (COZAAR) 100 MG tablet Take 1 tablet (100 mg total) by mouth daily. (Needs to be seen for July 6 mos ckup  . simvastatin (ZOCOR) 40 MG tablet TAKE 1 TABLET BY MOUTH EVERYDAY AT BEDTIME (Needs to be seen for July 6 mos ckup     Past Surgical History:  Procedure Laterality Date  . ABDOMINAL HYSTERECTOMY    . COLONOSCOPY    . DENTAL RESTORATION/EXTRACTION WITH X-RAY    . DILATION AND CURETTAGE OF UTERUS    . MOHS SURGERY    . 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 Diabetes Mother        pre diabetic  per patient  . Heart disease Father   . Diabetes Sister   . Down syndrome Sister   . Diabetes Brother   . Diabetes Brother   . Colon cancer Paternal Uncle        19's  . Colon cancer Paternal Grandmother        79's  . Esophageal cancer Neg Hx   . Rectal cancer Neg Hx   . Stomach cancer Neg Hx     New complaints: None today  Social history: Lives with  Husband  Controlled substance contract: n/a    Review of Systems  Constitutional: Negative for activity change and appetite change.  HENT: Negative.   Eyes: Negative for pain.  Respiratory: Negative for shortness of breath.   Cardiovascular: Negative for chest pain, palpitations and leg swelling.  Gastrointestinal: Negative for abdominal pain.  Endocrine: Negative for polydipsia.  Genitourinary: Negative.   Skin: Negative for rash.   Neurological: Negative for dizziness, weakness and headaches.  Hematological: Does not bruise/bleed easily.  Psychiatric/Behavioral: Negative.   All other systems reviewed and are negative.      Objective:   Physical Exam Vitals signs and nursing note reviewed.  Constitutional:      General: She is not in acute distress.    Appearance: Normal appearance. She is well-developed.  HENT:     Head: Normocephalic.     Nose: Nose normal.  Eyes:     Pupils: Pupils are equal, round, and reactive to light.  Neck:     Musculoskeletal: Normal range of motion and neck supple.     Vascular: No carotid bruit or JVD.  Cardiovascular:     Rate and Rhythm: Normal rate and regular rhythm.     Heart sounds: Normal heart sounds.  Pulmonary:     Effort: Pulmonary effort is normal. No respiratory distress.     Breath sounds: Normal breath sounds. No wheezing or rales.  Chest:     Chest wall: No tenderness.  Abdominal:     General: Bowel sounds are normal. There is no distension or abdominal bruit.     Palpations: Abdomen is soft. There is no hepatomegaly, splenomegaly, mass or pulsatile mass.     Tenderness: There is no abdominal tenderness.  Musculoskeletal: Normal range of motion.  Lymphadenopathy:     Cervical: No cervical adenopathy.  Skin:    General: Skin is warm and dry.  Neurological:     Mental Status: She is alert and oriented to person, place, and time.     Deep Tendon Reflexes: Reflexes are normal and symmetric.  Psychiatric:        Behavior: Behavior normal.        Thought Content: Thought content normal.        Judgment: Judgment normal.    BP (!) 142/75 (BP Location: Left Arm, Cuff Size: Large)   Pulse 62   Temp (!) 97.1 F (36.2 C) (Oral)   Ht _0  (1.626 m)   Wt 212 lb 4 oz (96.3 kg)   LMP 06/01/1997   SpO2 97%   BMI 36.43 kg/m         Assessment & Plan:  Linda Hernandez comes in today with chief complaint of Medical Management of Chronic Issues   Diagnosis  and orders addressed:  1. Essential hypertension Low sodium diet - losartan (COZAAR) 100 MG tablet; Take 1 tablet (100 mg total)  by mouth daily. (Needs to be seen for July 6 mos ckup  Dispense: 30 tablet; Refill: 0 - CMP14+EGFR  2. Mixed hyperlipidemia Low fat diet - simvastatin (ZOCOR) 40 MG tablet; TAKE 1 TABLET BY MOUTH EVERYDAY AT BEDTIME (Needs to be seen for July 6 mos ckup  Dispense: 30 tablet; Refill: 0 - Lipid panel  3. Postoperative hypothyroidism - levothyroxine (SYNTHROID) 50 MCG tablet; Take 1 tablet (50 mcg total) by mouth daily. (Needs labwork  Dispense: 30 tablet; Refill: 0 - Thyroid Panel With TSH  4. Recurrent major depressive disorder, in full remission ([place]) Stress management - DULoxetine (CYMBALTA) 30 MG capsule; Take 1 capsule (30 mg total) by mouth daily. (Needs to be seen for 6 mos ckup)  Dispense: 30 capsule; Refill: 0  5. GAD (generalized anxiety disorder)  6. Osteopenia of lumbar spine Weight bearing exercise  7. Morbid obesity ([place]) Discussed diet and exercise for person with BMI >25 Will recheck weight in 3-6 months    Labs pending Health Maintenance reviewed Diet and exercise encouraged  Follow up plan: 6 months   [name] Done, FNP

## 2018-11-18 LAB — CMP14+EGFR
ALT: 14 IU/L (ref 0–32)
AST: 19 IU/L (ref 0–40)
Albumin/Globulin Ratio: 1.7 (ref 1.2–2.2)
Albumin: 4.3 g/dL (ref 3.8–4.8)
Alkaline Phosphatase: 80 IU/L (ref 39–117)
BUN/Creatinine Ratio: 19 (ref 12–28)
BUN: 19 mg/dL (ref 8–27)
Bilirubin Total: 0.5 mg/dL (ref 0.0–1.2)
CO2: 24 mmol/L (ref 20–29)
Calcium: 10.5 mg/dL — ABNORMAL HIGH (ref 8.7–10.3)
Chloride: 102 mmol/L (ref 96–106)
Creatinine, Ser: 0.99 mg/dL (ref 0.57–1.00)
GFR calc Af Amer: 69 mL/min/{1.73_m2} (ref >59)
GFR calc non Af Amer: 60 mL/min/{1.73_m2} (ref >59)
Globulin, Total: 2.5 g/dL (ref 1.5–4.5)
Glucose: 112 mg/dL — ABNORMAL HIGH (ref 65–99)
Potassium: 5.4 mmol/L — ABNORMAL HIGH (ref 3.5–5.2)
Sodium: 140 mmol/L (ref 134–144)
Total Protein: 6.8 g/dL (ref 6.0–8.5)

## 2018-11-18 LAB — THYROID PANEL WITH TSH
Free Thyroxine Index: 3 (ref 1.2–4.9)
T3 Uptake Ratio: 28 % (ref 24–39)
T4, Total: 10.6 ug/dL (ref 4.5–12.0)
TSH: 0.673 u[IU]/mL (ref 0.450–4.500)

## 2018-11-18 LAB — LIPID PANEL
Chol/HDL Ratio: 2.4 ratio (ref 0.0–4.4)
Cholesterol, Total: 144 mg/dL (ref 100–199)
HDL: 61 mg/dL (ref >39)
LDL Chol Calc (NIH): 61 mg/dL (ref 0–99)
Triglycerides: 124 mg/dL (ref 0–149)
VLDL Cholesterol Cal: 22 mg/dL (ref 5–40)

## 2018-11-23 ENCOUNTER — Ambulatory Visit (INDEPENDENT_AMBULATORY_CARE_PROVIDER_SITE_OTHER): Payer: Medicare Other | Admitting: Family Medicine

## 2018-11-23 ENCOUNTER — Encounter: Payer: Self-pay | Admitting: Family Medicine

## 2018-11-23 DIAGNOSIS — B029 Zoster without complications: Secondary | ICD-10-CM

## 2018-11-23 DIAGNOSIS — R21 Rash and other nonspecific skin eruption: Secondary | ICD-10-CM | POA: Diagnosis not present

## 2018-11-23 MED ORDER — VALACYCLOVIR HCL 1 G PO TABS: 1000.0000 mg | ORAL_TABLET | Freq: Three times a day (TID) | ORAL | 0 refills | Status: AC

## 2018-11-23 NOTE — Progress Notes (Signed)
Virtual Visit via telephone Note Due to COVID-19 pandemic this visit was conducted virtually. This visit type was conducted due to national recommendations for restrictions regarding the COVID-19 Pandemic (e.g. social distancing, sheltering in place) in an effort to limit this patient's exposure and mitigate transmission in our community. All issues noted in this document were discussed and addressed.  A physical exam was not performed with this format.   I connected with Linda Hernandez on 11/23/18 at 1105 by telephone and verified that I am speaking with the correct person using two identifiers. Linda Hernandez is currently located at home and no one is currently with them during visit. The provider, Monia Pouch, FNP is located in their office at time of visit.  I discussed the limitations, risks, security and privacy concerns of performing an evaluation and management service by telephone and the availability of in person appointments. I also discussed with the patient that there may be a patient responsible charge related to this service. The patient expressed understanding and agreed to proceed.  Subjective:  Patient ID: Linda Hernandez, female    DOB: Apr 12, 1950, 68 y.o.   MRN: [id number]  Chief Complaint:  Rash   HPI: Linda Hernandez is a 68 y.o. female presenting on 11/23/2018 for Rash   Pt reports a red raised rash that starts under her left breast and goes around to left mid back and left hip. States this started about 10 days ago. She denies exposure to any new products or medications. No new animal contact. States the rash is not painful, slightly pruritic at times. No other associated symptoms. She denies rash on right side of body.   Rash This is a new problem. The current episode started 1 to 4 weeks ago. The problem is unchanged. The affected locations include the back, chest and left hip (left sided). The rash is characterized by redness and itchiness (vesicular). She was exposed to  nothing. Pertinent negatives include no anorexia, congestion, cough, diarrhea, eye pain, facial edema, fatigue, fever, joint pain, nail changes, rhinorrhea, shortness of breath, sore throat or vomiting. Past treatments include anti-itch cream. The treatment provided no relief.     Relevant past medical, surgical, family, and social history reviewed and updated as indicated.  Allergies and medications reviewed and updated.   Past Medical History:  Diagnosis Date  . Anxiety   . Hyperlipidemia   . Hypertension   . Snoring   . Thyroid disease    thyroid tumor    Past Surgical History:  Procedure Laterality Date  . ABDOMINAL HYSTERECTOMY    . COLONOSCOPY    . DENTAL RESTORATION/EXTRACTION WITH X-RAY    . DILATION AND CURETTAGE OF UTERUS    . MOHS SURGERY    . TUMOR EXCISION     thyroid 1975  . tumor of thyroid gland      Social History   Socioeconomic History  . Marital status: Married    Spouse name: Linda Hernandez  . Number of children: 3  . Years of education: 65  . Highest education level: High school graduate  Occupational History  . Occupation: retired  Scientific laboratory technician  . Financial resource strain: Not hard at all  . Food insecurity    Worry: Never true    Inability: Never true  . Transportation needs    Medical: No    Non-medical: No  Tobacco Use  . Smoking status: Never Smoker  . Smokeless tobacco: Never Used  Substance and Sexual Activity  .  Alcohol use: Yes    Comment: extremely rare 2 times yearly  . Drug use: No  . Sexual activity: Yes    Birth control/protection: Surgical  Lifestyle  . Physical activity    Days per week: 0 days    Minutes per session: 0 min  . Stress: Not at all  Relationships  . Social connections    Talks on phone: More than three times a week    Gets together: Twice a week    Attends religious service: More than 4 times per year    Active member of club or organization: Yes    Attends meetings of clubs or organizations: 1 to 4 times  per year    Relationship status: Married  . Intimate partner violence    Fear of current or ex partner: No    Emotionally abused: No    Physically abused: No    Forced sexual activity: No  Other Topics Concern  . Not on file  Social History Narrative  . Not on file    Outpatient Encounter Medications as of 11/23/2018  Medication Sig  . Ascorbic Acid (VITAMIN C) 100 MG tablet Take 100 mg by mouth daily.  . Calcium Carbonate (CALTRATE 600 PO) Take by mouth.  . Cholecalciferol (VITAMIN D) 2000 units tablet Take 2,000 Units by mouth daily.   . cyanocobalamin 1000 MCG tablet Take 1,000 mcg by mouth daily.  . DULoxetine (CYMBALTA) 30 MG capsule Take 1 capsule (30 mg total) by mouth daily. (Needs to be seen for 6 mos ckup)  . fish oil-omega-3 fatty acids 1000 MG capsule Take 2 g by mouth daily.  Marland Kitchen levothyroxine (SYNTHROID) 50 MCG tablet Take 1 tablet (50 mcg total) by mouth daily. (Needs labwork  . losartan (COZAAR) 100 MG tablet Take 1 tablet (100 mg total) by mouth daily. (Needs to be seen for July 6 mos ckup  . simvastatin (ZOCOR) 40 MG tablet TAKE 1 TABLET BY MOUTH EVERYDAY AT BEDTIME (Needs to be seen for July 6 mos ckup  . valACYclovir (VALTREX) 1000 MG tablet Take 1 tablet (1,000 mg total) by mouth 3 (three) times daily for 7 days.   Facility-Administered Encounter Medications as of 11/23/2018  Medication  . 0.9 %  sodium chloride infusion    Allergies  Allergen Reactions  . Penicillins Rash    Review of Systems  Constitutional: Negative for activity change, appetite change, chills, diaphoresis, fatigue, fever and unexpected weight change.  HENT: Negative.  Negative for congestion, hearing loss, rhinorrhea and sore throat.   Eyes: Negative.  Negative for photophobia, pain, redness and visual disturbance.  Respiratory: Negative for apnea, cough, choking, chest tightness, shortness of breath, wheezing and stridor.   Cardiovascular: Negative for chest pain, palpitations and leg  swelling.  Gastrointestinal: Negative for abdominal pain, anorexia, blood in stool, constipation, diarrhea, nausea and vomiting.  Endocrine: Negative.   Genitourinary: Negative for decreased urine volume, difficulty urinating, dysuria, frequency and urgency.  Musculoskeletal: Negative for arthralgias, joint pain and myalgias.  Skin: Positive for rash. Negative for color change, nail changes, pallor and wound.  Allergic/Immunologic: Negative.   Neurological: Negative for dizziness, tremors, seizures, syncope, facial asymmetry, speech difficulty, weakness, light-headedness, numbness and headaches.  Hematological: Negative.  Negative for adenopathy.  Psychiatric/Behavioral: Negative for confusion, hallucinations, sleep disturbance and suicidal ideas.  All other systems reviewed and are negative.        Observations/Objective: No vital signs or physical exam, this was a telephone or virtual health encounter.  Pt  alert and oriented, answers all questions appropriately, and able to speak in full sentences.    Assessment and Plan: Barbette was seen today for rash.  Diagnoses and all orders for this visit:  Rash Herpes zoster without complication Reported symptoms concerning for shingles due to dermatomal distribution and description of vesicular lesions. Will initiate Valtrex. Pt aware to report any new, worsening, or persistent symptoms. Pt aware to report if rash crosses midline. Medications as prescribed. Follow up as needed.  -     valACYclovir (VALTREX) 1000 MG tablet; Take 1 tablet (1,000 mg total) by mouth 3 (three) times daily for 7 days.     Follow Up Instructions: Return if symptoms worsen or fail to improve.    I discussed the assessment and treatment plan with the patient. The patient was provided an opportunity to ask questions and all were answered. The patient agreed with the plan and demonstrated an understanding of the instructions.   The patient was advised to call back  or seek an in-person evaluation if the symptoms worsen or if the condition fails to improve as anticipated.  The above assessment and management plan was discussed with the patient. The patient verbalized understanding of and has agreed to the management plan. Patient is aware to call the clinic if they develop any new symptoms or if symptoms persist or worsen. Patient is aware when to return to the clinic for a follow-up visit. Patient educated on when it is appropriate to go to the emergency department.    I provided 15 minutes of non-face-to-face time during this encounter. The call started at 1105. The call ended at 1120. The other time was used for coordination of care.    Monia Pouch, FNP-C [facility] [address] [place], [place] [address] [phone] 11/23/18

## 2018-12-15 ENCOUNTER — Other Ambulatory Visit: Payer: Self-pay | Admitting: Nurse Practitioner

## 2018-12-15 DIAGNOSIS — F3342 Major depressive disorder, recurrent, in full remission: Secondary | ICD-10-CM

## 2018-12-15 NOTE — Telephone Encounter (Signed)
Ov 11/17/18 rtc 6 mos

## 2018-12-16 ENCOUNTER — Other Ambulatory Visit: Payer: Self-pay | Admitting: Nurse Practitioner

## 2018-12-16 DIAGNOSIS — I10 Essential (primary) hypertension: Secondary | ICD-10-CM

## 2018-12-16 DIAGNOSIS — E890 Postprocedural hypothyroidism: Secondary | ICD-10-CM

## 2018-12-16 DIAGNOSIS — E782 Mixed hyperlipidemia: Secondary | ICD-10-CM

## 2018-12-25 ENCOUNTER — Other Ambulatory Visit: Payer: Self-pay

## 2018-12-25 DIAGNOSIS — R928 Other abnormal and inconclusive findings on diagnostic imaging of breast: Secondary | ICD-10-CM

## 2018-12-25 NOTE — Progress Notes (Signed)
Incomplete/abnormal screnning mammogram - orders placed for additional imaging. Patient has appointment with [facility] on 12/29/18. MPulliam, CMA/RT(R)

## 2019-03-30 ENCOUNTER — Encounter: Payer: Self-pay | Admitting: Nurse Practitioner

## 2019-04-25 ENCOUNTER — Ambulatory Visit: Payer: Medicare Other | Attending: Internal Medicine

## 2019-04-25 DIAGNOSIS — Z23 Encounter for immunization: Secondary | ICD-10-CM

## 2019-04-25 NOTE — Progress Notes (Signed)
   Covid-19 Vaccination Clinic  Name:  Linda Hernandez    MRN: [id number] DOB: May 25, 1950  04/25/2019  [name] was observed post Covid-19 immunization for 15 minutes without incidence. She was provided with Vaccine Information Sheet and instruction to access the V-Safe system.   [name] was instructed to call 911 with any severe reactions post vaccine: Marland Kitchen Difficulty breathing  . Swelling of your face and throat  . A fast heartbeat  . A bad rash all over your body  . Dizziness and weakness    Immunizations Administered    Name Date Dose VIS Date Route   Pfizer COVID-19 Vaccine 04/25/2019  1:48 PM 0.3 mL 02/19/2019 Intramuscular   Manufacturer: [place]   Lot: [id number]   [place]: [id number]

## 2019-05-18 ENCOUNTER — Ambulatory Visit: Payer: Medicare Other | Attending: Internal Medicine

## 2019-05-18 ENCOUNTER — Ambulatory Visit: Payer: Self-pay | Admitting: Nurse Practitioner

## 2019-05-18 DIAGNOSIS — Z23 Encounter for immunization: Secondary | ICD-10-CM | POA: Insufficient documentation

## 2019-05-18 NOTE — Progress Notes (Signed)
   Covid-19 Vaccination Clinic  Name:  Linda Hernandez    MRN: [id number] DOB: 01-Nov-1950  05/18/2019  [name] was observed post Covid-19 immunization for 15 minutes without incident. She was provided with Vaccine Information Sheet and instruction to access the V-Safe system.   [name] was instructed to call 911 with any severe reactions post vaccine: Marland Kitchen Difficulty breathing  . Swelling of face and throat  . A fast heartbeat  . A bad rash all over body  . Dizziness and weakness   Immunizations Administered    Name Date Dose VIS Date Route   Pfizer COVID-19 Vaccine 05/18/2019  9:00 AM 0.3 mL 02/19/2019 Intramuscular   Manufacturer: [place]   Lot: [id number]   [place]: [id number]

## 2019-05-19 ENCOUNTER — Ambulatory Visit: Payer: Medicare Other

## 2019-05-25 ENCOUNTER — Other Ambulatory Visit: Payer: Self-pay

## 2019-05-25 ENCOUNTER — Ambulatory Visit: Payer: Medicare Other | Admitting: Nurse Practitioner

## 2019-05-25 ENCOUNTER — Encounter: Payer: Self-pay | Admitting: Nurse Practitioner

## 2019-05-25 VITALS — BP 131/70 | HR 60 | Temp 97.3°F | Resp 20 | Ht 64.0 in | Wt 212.0 lb

## 2019-05-25 DIAGNOSIS — E782 Mixed hyperlipidemia: Secondary | ICD-10-CM | POA: Diagnosis not present

## 2019-05-25 DIAGNOSIS — F411 Generalized anxiety disorder: Secondary | ICD-10-CM

## 2019-05-25 DIAGNOSIS — I10 Essential (primary) hypertension: Secondary | ICD-10-CM | POA: Diagnosis not present

## 2019-05-25 DIAGNOSIS — E890 Postprocedural hypothyroidism: Secondary | ICD-10-CM | POA: Diagnosis not present

## 2019-05-25 DIAGNOSIS — M8588 Other specified disorders of bone density and structure, other site: Secondary | ICD-10-CM | POA: Diagnosis not present

## 2019-05-25 DIAGNOSIS — F3342 Major depressive disorder, recurrent, in full remission: Secondary | ICD-10-CM

## 2019-05-25 MED ORDER — LOSARTAN POTASSIUM 100 MG PO TABS: 100.0000 mg | ORAL_TABLET | Freq: Every day | ORAL | 1 refills | Status: DC

## 2019-05-25 MED ORDER — DULOXETINE HCL 30 MG PO CPEP: 30.0000 mg | ORAL_CAPSULE | Freq: Every day | ORAL | 1 refills | Status: DC

## 2019-05-25 MED ORDER — SIMVASTATIN 40 MG PO TABS: 40.0000 mg | ORAL_TABLET | Freq: Every day | ORAL | 1 refills | Status: DC

## 2019-05-25 MED ORDER — LEVOTHYROXINE SODIUM 50 MCG PO TABS: 50.0000 ug | ORAL_TABLET | Freq: Every day | ORAL | 3 refills | Status: DC

## 2019-05-25 NOTE — Progress Notes (Signed)
Subjective:    Patient ID: Linda Hernandez, female    DOB: 03-Apr-1950, 69 y.o.   MRN: [id number]   Chief Complaint: Medical Management of Chronic Issues    HPI:  1. Essential hypertension Does not check BP at home. Is trying to watch sodium intake. Denies chest pain, sob, headaches, dizziness. BP Readings from Last 3 Encounters:  11/17/18 (!) 142/75  02/24/18 118/67  09/15/17 135/69     2. Postoperative hypothyroidism Reports no problems at this time. Lab Results  Component Value Date   TSH 0.673 11/17/2018     3. Osteopenia of lumbar spine Takes vitamin D and calcium supplement. Walks 30 minutes a couple of times a week. Last Dexa scan 2019. T-score was -1.8.  4. Mixed hyperlipidemia Does not watch fat or cholesterol in diet very much. Lab Results  Component Value Date   CHOL 144 11/17/2018   HDL 61 11/17/2018   LDLCALC 61 11/17/2018   TRIG 124 11/17/2018   CHOLHDL 2.4 11/17/2018     5. GAD (generalized anxiety disorder) Anxiety is under control. GAD 7 : Generalized Anxiety Score 05/25/2019 08/22/2017  Nervous, Anxious, on Edge 0 0  Control/stop worrying 0 0  Worry too much - different things 0 0  Trouble relaxing 0 0  Restless 0 0  Easily annoyed or irritable 0 1  Afraid - awful might happen 0 0  Total GAD 7 Score 0 1  Anxiety Difficulty Not difficult at all Not difficult at all      6. Recurrent major depressive disorder, in full remission ([place]) No problems at this time. PHQ9 SCORE ONLY 05/25/2019 11/17/2018 09/18/2018  Score 0 0 0     7. Morbid obesity ([place]) No significant changes in weight. BMI Readings from Last 3 Encounters:  05/25/19 36.39 kg/m  11/17/18 36.43 kg/m  02/24/18 36.05 kg/m   Wt Readings from Last 3 Encounters:  05/25/19 212 lb (96.2 kg)  11/17/18 212 lb 4 oz (96.3 kg)  02/24/18 210 lb (95.3 kg)      Outpatient Encounter Medications as of 05/25/2019  Medication Sig  . Ascorbic Acid (VITAMIN C) 100 MG tablet Take 100 mg  by mouth daily.  . Calcium Carbonate (CALTRATE 600 PO) Take by mouth.  . Cholecalciferol (VITAMIN D) 2000 units tablet Take 2,000 Units by mouth daily.   . cyanocobalamin 1000 MCG tablet Take 1,000 mcg by mouth daily.  . DULoxetine (CYMBALTA) 30 MG capsule Take 1 capsule (30 mg total) by mouth daily.  . fish oil-omega-3 fatty acids 1000 MG capsule Take 2 g by mouth daily.  Marland Kitchen levothyroxine (SYNTHROID) 50 MCG tablet Take 1 tablet (50 mcg total) by mouth daily.  Marland Kitchen losartan (COZAAR) 100 MG tablet Take 1 tablet (100 mg total) by mouth daily.  . simvastatin (ZOCOR) 40 MG tablet Take 1 tablet (40 mg total) by mouth daily at 6 PM.   Facility-Administered Encounter Medications as of 05/25/2019  Medication  . 0.9 %  sodium chloride infusion    Past Surgical History:  Procedure Laterality Date  . ABDOMINAL HYSTERECTOMY    . COLONOSCOPY    . DENTAL RESTORATION/EXTRACTION WITH X-RAY    . DILATION AND CURETTAGE OF UTERUS    . MOHS SURGERY    . 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 Diabetes Mother        pre diabetic  per patient  . Heart disease Father   . Diabetes Sister   . Down syndrome Sister   . Diabetes Brother   . Diabetes Brother   . Colon cancer Paternal Uncle        65's  . Colon cancer Paternal Grandmother        57's  . Esophageal cancer Neg Hx   . Rectal cancer Neg Hx   . Stomach cancer Neg Hx     New complaints: None.  Social history: Lives with husband. Has 3 grown sons and 6 grandchildren.  Controlled substance contract: N/A     Review of Systems  Constitutional: Negative.   HENT: Negative.   Eyes: Negative.   Respiratory: Negative.   Cardiovascular: Negative.   Gastrointestinal: Negative.   Endocrine: Negative.   Genitourinary: Negative.   Musculoskeletal: Negative.   Skin: Negative.   Allergic/Immunologic: Negative.   Neurological: Negative.   Hematological: Negative.     Psychiatric/Behavioral: Negative.        Objective:   Physical Exam Vitals and nursing note reviewed.  Constitutional:      Appearance: Normal appearance.  HENT:     Head: Normocephalic.     Right Ear: Tympanic membrane normal.     Left Ear: Tympanic membrane normal.     Nose: Nose normal.     Mouth/Throat:     Mouth: Mucous membranes are moist.     Pharynx: Oropharynx is clear.  Eyes:     Conjunctiva/sclera: Conjunctivae normal.     Pupils: Pupils are equal, round, and reactive to light.  Cardiovascular:     Rate and Rhythm: Normal rate and regular rhythm.     Pulses: Normal pulses.     Heart sounds: Normal heart sounds.  Pulmonary:     Effort: Pulmonary effort is normal.     Breath sounds: Normal breath sounds.  Abdominal:     General: Bowel sounds are normal.     Palpations: Abdomen is soft.  Musculoskeletal:        General: Normal range of motion.     Cervical back: Normal range of motion and neck supple.  Skin:    General: Skin is warm and dry.     Capillary Refill: Capillary refill takes less than 2 seconds.  Neurological:     Mental Status: She is alert and oriented to person, place, and time.  Psychiatric:        Behavior: Behavior normal.   BP 131/70   Pulse 60   Temp (!) 97.3 F (36.3 C) (Temporal)   Resp 20   Ht 5' 4"  (1.626 m)   Wt 212 lb (96.2 kg)   LMP 06/01/1997   SpO2 98%   BMI 36.39 kg/m       Assessment & Plan:  Linda Hernandez comes in today with chief complaint of Medical Management of Chronic Issues   Diagnosis and orders addressed:  1. Essential hypertension Low sodium diet.  2. Postoperative hypothyroidism   3. Osteopenia of lumbar spine Weight-bearing exercise.  4. Mixed hyperlipidemia Low fat diet.  5. GAD (generalized anxiety disorder) Stress management.  6. Recurrent major depressive disorder, in full remission ([place]) Stress management  7. Morbid obesity ([place]) Discussed diet and exercise for person with BMI  >25 Will recheck weight in 3-6 months   Lab Orders     CMP14+EGFR     CBC with Differential/Platelet     Lipid panel     Thyroid Panel With TSH   Meds  ordered this encounter  Medications  . losartan (COZAAR) 100 MG tablet    Sig: Take 1 tablet (100 mg total) by mouth daily.    Dispense:  90 tablet    Refill:  1    Order Specific Question:   Supervising Provider    Answer:   Caryl Pina A [id number]  . simvastatin (ZOCOR) 40 MG tablet    Sig: Take 1 tablet (40 mg total) by mouth daily at 6 PM.    Dispense:  90 tablet    Refill:  1    Order Specific Question:   Supervising Provider    Answer:   Caryl Pina A [id number]  . levothyroxine (SYNTHROID) 50 MCG tablet    Sig: Take 1 tablet (50 mcg total) by mouth daily.    Dispense:  90 tablet    Refill:  3    Order Specific Question:   Supervising Provider    Answer:   Caryl Pina A [id number]  . DULoxetine (CYMBALTA) 30 MG capsule    Sig: Take 1 capsule (30 mg total) by mouth daily.    Dispense:  90 capsule    Refill:  1    Order Specific Question:   Supervising Provider    Answer:   Caryl Pina A [id number]     Labs pending Health Maintenance reviewed Diet and exercise encouraged  Follow up plan: 6 months   [name] Done, FNP

## 2019-05-25 NOTE — Patient Instructions (Signed)

## 2019-05-26 LAB — LIPID PANEL
Chol/HDL Ratio: 2.1 ratio (ref 0.0–4.4)
Cholesterol, Total: 139 mg/dL (ref 100–199)
HDL: 67 mg/dL (ref >39)
LDL Chol Calc (NIH): 51 mg/dL (ref 0–99)
Triglycerides: 122 mg/dL (ref 0–149)
VLDL Cholesterol Cal: 21 mg/dL (ref 5–40)

## 2019-05-26 LAB — CMP14+EGFR
ALT: 13 IU/L (ref 0–32)
AST: 16 IU/L (ref 0–40)
Albumin/Globulin Ratio: 1.8 (ref 1.2–2.2)
Albumin: 4.2 g/dL (ref 3.8–4.8)
Alkaline Phosphatase: 78 IU/L (ref 39–117)
BUN/Creatinine Ratio: 18 (ref 12–28)
BUN: 16 mg/dL (ref 8–27)
Bilirubin Total: 0.7 mg/dL (ref 0.0–1.2)
CO2: 26 mmol/L (ref 20–29)
Calcium: 10.5 mg/dL — ABNORMAL HIGH (ref 8.7–10.3)
Chloride: 100 mmol/L (ref 96–106)
Creatinine, Ser: 0.9 mg/dL (ref 0.57–1.00)
GFR calc Af Amer: 77 mL/min/{1.73_m2} (ref >59)
GFR calc non Af Amer: 66 mL/min/{1.73_m2} (ref >59)
Globulin, Total: 2.3 g/dL (ref 1.5–4.5)
Glucose: 121 mg/dL — ABNORMAL HIGH (ref 65–99)
Potassium: 5 mmol/L (ref 3.5–5.2)
Sodium: 138 mmol/L (ref 134–144)
Total Protein: 6.5 g/dL (ref 6.0–8.5)

## 2019-05-26 LAB — CBC WITH DIFFERENTIAL/PLATELET
Basophils Absolute: 0.1 10*3/uL (ref 0.0–0.2)
Basos: 1 %
EOS (ABSOLUTE): 0.2 10*3/uL (ref 0.0–0.4)
Eos: 4 %
Hematocrit: 45.2 % (ref 34.0–46.6)
Hemoglobin: 15.5 g/dL (ref 11.1–15.9)
Immature Grans (Abs): 0 10*3/uL (ref 0.0–0.1)
Immature Granulocytes: 0 %
Lymphocytes Absolute: 1.7 10*3/uL (ref 0.7–3.1)
Lymphs: 36 %
MCH: 30.9 pg (ref 26.6–33.0)
MCHC: 34.3 g/dL (ref 31.5–35.7)
MCV: 90 fL (ref 79–97)
Monocytes Absolute: 0.4 10*3/uL (ref 0.1–0.9)
Monocytes: 8 %
Neutrophils Absolute: 2.4 10*3/uL (ref 1.4–7.0)
Neutrophils: 51 %
Platelets: 221 10*3/uL (ref 150–450)
RBC: 5.02 x10E6/uL (ref 3.77–5.28)
RDW: 12.7 % (ref 11.7–15.4)
WBC: 4.7 10*3/uL (ref 3.4–10.8)

## 2019-05-26 LAB — THYROID PANEL WITH TSH
Free Thyroxine Index: 2.9 (ref 1.2–4.9)
T3 Uptake Ratio: 27 % (ref 24–39)
T4, Total: 10.7 ug/dL (ref 4.5–12.0)
TSH: 0.756 u[IU]/mL (ref 0.450–4.500)

## 2019-06-04 ENCOUNTER — Ambulatory Visit: Payer: Medicare Other | Admitting: Nurse Practitioner

## 2019-06-30 ENCOUNTER — Telehealth: Payer: Self-pay | Admitting: Nurse Practitioner

## 2019-06-30 NOTE — Telephone Encounter (Signed)
Hard copy of orders have been faxed to [facility]

## 2019-06-30 NOTE — Telephone Encounter (Signed)
Donette called from [facility] stating that patient has an appt with them tomorrow at 12:30 and they need [place] to send them an order for patient to have 3D Right Diagnostic Mammogram and [place]. Can fax order to [phone]

## 2019-07-02 NOTE — Telephone Encounter (Signed)
error 

## 2019-11-29 ENCOUNTER — Other Ambulatory Visit: Payer: Self-pay | Admitting: Nurse Practitioner

## 2019-11-29 DIAGNOSIS — E782 Mixed hyperlipidemia: Secondary | ICD-10-CM

## 2019-11-29 DIAGNOSIS — I10 Essential (primary) hypertension: Secondary | ICD-10-CM

## 2019-11-30 ENCOUNTER — Ambulatory Visit (INDEPENDENT_AMBULATORY_CARE_PROVIDER_SITE_OTHER): Payer: Medicare Other

## 2019-11-30 ENCOUNTER — Other Ambulatory Visit: Payer: Self-pay

## 2019-11-30 ENCOUNTER — Ambulatory Visit: Payer: Medicare Other | Admitting: Nurse Practitioner

## 2019-11-30 ENCOUNTER — Encounter: Payer: Self-pay | Admitting: Nurse Practitioner

## 2019-11-30 VITALS — BP 132/69 | HR 58 | Temp 97.6°F | Resp 20 | Ht 64.0 in | Wt 208.0 lb

## 2019-11-30 DIAGNOSIS — E890 Postprocedural hypothyroidism: Secondary | ICD-10-CM

## 2019-11-30 DIAGNOSIS — Z23 Encounter for immunization: Secondary | ICD-10-CM

## 2019-11-30 DIAGNOSIS — I10 Essential (primary) hypertension: Secondary | ICD-10-CM | POA: Diagnosis not present

## 2019-11-30 DIAGNOSIS — E782 Mixed hyperlipidemia: Secondary | ICD-10-CM | POA: Diagnosis not present

## 2019-11-30 DIAGNOSIS — F3342 Major depressive disorder, recurrent, in full remission: Secondary | ICD-10-CM | POA: Diagnosis not present

## 2019-11-30 DIAGNOSIS — M8588 Other specified disorders of bone density and structure, other site: Secondary | ICD-10-CM

## 2019-11-30 DIAGNOSIS — F411 Generalized anxiety disorder: Secondary | ICD-10-CM

## 2019-11-30 MED ORDER — DULOXETINE HCL 30 MG PO CPEP: 30.0000 mg | ORAL_CAPSULE | Freq: Every day | ORAL | 1 refills | Status: DC

## 2019-11-30 MED ORDER — LOSARTAN POTASSIUM 100 MG PO TABS: 100.0000 mg | ORAL_TABLET | Freq: Every day | ORAL | 1 refills | Status: DC

## 2019-11-30 MED ORDER — LEVOTHYROXINE SODIUM 50 MCG PO TABS: 50.0000 ug | ORAL_TABLET | Freq: Every day | ORAL | 1 refills | Status: DC

## 2019-11-30 MED ORDER — SIMVASTATIN 40 MG PO TABS: 40.0000 mg | ORAL_TABLET | Freq: Every day | ORAL | 1 refills | Status: DC

## 2019-11-30 NOTE — Progress Notes (Signed)
Subjective:    Patient ID: Linda Hernandez, female    DOB: October 09, 1950, 69 y.o.   MRN: [id number]   Chief Complaint: Medical Management of Chronic Issues    HPI:  1. Essential hypertension No c/o chest pain, sob or headache. Does not check blood pressure at home. BP Readings from Last 3 Encounters:  11/30/19 132/69  05/25/19 131/70  11/17/18 (!) 142/75     2. Mixed hyperlipidemia Trying to watch diet but little to no exercise. Lab Results  Component Value Date   CHOL 139 05/25/2019   HDL 67 05/25/2019   LDLCALC 51 05/25/2019   TRIG 122 05/25/2019   CHOLHDL 2.1 05/25/2019     3. Postoperative hypothyroidism No problems that she is aware of. Lab Results  Component Value Date   TSH 0.756 05/25/2019     4. Recurrent major depressive disorder, in full remission ([place]) Is on cymbalta which is working well for her. Depression screen [facility] 2/9 11/30/2019 05/25/2019 11/17/2018  Decreased Interest 0 0 0  Down, Depressed, Hopeless 0 0 0  PHQ - 2 Score 0 0 0  Altered sleeping 0 - 0  Tired, decreased energy 0 - 0  Change in appetite 0 - 0  Feeling bad or failure about yourself  0 - 0  Trouble concentrating 0 - 0  Moving slowly or fidgety/restless 0 - 0  Suicidal thoughts 0 - 0  PHQ-9 Score 0 - 0  Difficult doing work/chores Not difficult at all - -     5. GAD (generalized anxiety disorder) Is doing well.  GAD 7 : Generalized Anxiety Score 11/30/2019 05/25/2019 08/22/2017  Nervous, Anxious, on Edge 0 0 0  Control/stop worrying 0 0 0  Worry too much - different things 0 0 0  Trouble relaxing 0 0 0  Restless 0 0 0  Easily annoyed or irritable 0 0 1  Afraid - awful might happen 0 0 0  Total GAD 7 Score 0 0 1  Anxiety Difficulty Somewhat difficult Not difficult at all Not difficult at all      6. Osteopenia of lumbar spine Last dexascan was done 08/27/17 with t score of -1.8. she does take daily dose of vitamin d and calcium. No weight bearing exercise.  7. Morbid obesity  ([place]) No recent weight changes Wt Readings from Last 3 Encounters:  11/30/19 208 lb (94.3 kg)  05/25/19 212 lb (96.2 kg)  11/17/18 212 lb 4 oz (96.3 kg)   BMI Readings from Last 3 Encounters:  11/30/19 35.70 kg/m  05/25/19 36.39 kg/m  11/17/18 36.43 kg/m       Outpatient Encounter Medications as of 11/30/2019  Medication Sig  . Ascorbic Acid (VITAMIN C) 100 MG tablet Take 100 mg by mouth daily.  . Calcium Carbonate (CALTRATE 600 PO) Take by mouth.  . Cholecalciferol (VITAMIN D) 2000 units tablet Take 2,000 Units by mouth daily.   . cyanocobalamin 1000 MCG tablet Take 1,000 mcg by mouth daily.  . DULoxetine (CYMBALTA) 30 MG capsule Take 1 capsule (30 mg total) by mouth daily.  . fish oil-omega-3 fatty acids 1000 MG capsule Take 2 g by mouth daily.  Marland Kitchen levothyroxine (SYNTHROID) 50 MCG tablet Take 1 tablet (50 mcg total) by mouth daily.  Marland Kitchen losartan (COZAAR) 100 MG tablet TAKE 1 TABLET BY MOUTH EVERY DAY  . simvastatin (ZOCOR) 40 MG tablet TAKE 1 TABLET (40 MG TOTAL) BY MOUTH DAILY AT 6 PM.     Past Surgical History:  Procedure Laterality  Date  . ABDOMINAL HYSTERECTOMY    . COLONOSCOPY    . DENTAL RESTORATION/EXTRACTION WITH X-RAY    . DILATION AND CURETTAGE OF UTERUS    . MOHS SURGERY    . 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 Diabetes Mother        pre diabetic  per patient  . Heart disease Father   . Diabetes Sister   . Down syndrome Sister   . Diabetes Brother   . Diabetes Brother   . Colon cancer Paternal Uncle        90's  . Colon cancer Paternal Grandmother        109's  . Esophageal cancer Neg Hx   . Rectal cancer Neg Hx   . Stomach cancer Neg Hx     New complaints: None today  Social history: Lives with husband  Controlled substance contract: n/a    Review of Systems  Constitutional: Negative for diaphoresis.  Eyes: Negative for pain.  Respiratory: Negative for  shortness of breath.   Cardiovascular: Negative for chest pain, palpitations and leg swelling.  Gastrointestinal: Negative for abdominal pain.  Endocrine: Negative for polydipsia.  Skin: Negative for rash.  Neurological: Negative for dizziness, weakness and headaches.  Hematological: Does not bruise/bleed easily.  All other systems reviewed and are negative.      Objective:   Physical Exam Vitals and nursing note reviewed.  Constitutional:      General: She is not in acute distress.    Appearance: Normal appearance. She is well-developed.  HENT:     Head: Normocephalic.     Nose: Nose normal.  Eyes:     Pupils: Pupils are equal, round, and reactive to light.  Neck:     Vascular: No carotid bruit or JVD.  Cardiovascular:     Rate and Rhythm: Normal rate and regular rhythm.     Heart sounds: Normal heart sounds.  Pulmonary:     Effort: Pulmonary effort is normal. No respiratory distress.     Breath sounds: Normal breath sounds. No wheezing or rales.  Chest:     Chest wall: No tenderness.  Abdominal:     General: Bowel sounds are normal. There is no distension or abdominal bruit.     Palpations: Abdomen is soft. There is no hepatomegaly, splenomegaly, mass or pulsatile mass.     Tenderness: There is no abdominal tenderness.  Musculoskeletal:        General: Normal range of motion.     Cervical back: Normal range of motion and neck supple.  Lymphadenopathy:     Cervical: No cervical adenopathy.  Skin:    General: Skin is warm and dry.  Neurological:     Mental Status: She is alert and oriented to person, place, and time.     Deep Tendon Reflexes: Reflexes are normal and symmetric.  Psychiatric:        Behavior: Behavior normal.        Thought Content: Thought content normal.        Judgment: Judgment normal.    BP 132/69   Pulse (!) 58   Temp 97.6 F (36.4 C) (Temporal)   Resp 20   Ht 5' 4"  (1.626 m)   Wt 208 lb (94.3 kg)   LMP 06/01/1997   SpO2 95%   BMI  35.70 kg/m  Assessment & Plan:  Linda Hernandez comes in today with chief complaint of Medical Management of Chronic Issues   Diagnosis and orders addressed:  1. Essential hypertension low sodium diet - losartan (COZAAR) 100 MG tablet; Take 1 tablet (100 mg total) by mouth daily.  Dispense: 90 tablet; Refill: 1 - CBC with Differential/Platelet - CMP14+EGFR  2. Mixed hyperlipidemia Low fat diet - simvastatin (ZOCOR) 40 MG tablet; Take 1 tablet (40 mg total) by mouth daily at 6 PM.  Dispense: 90 tablet; Refill: 1 - Lipid panel  3. Postoperative hypothyroidism  - levothyroxine (SYNTHROID) 50 MCG tablet; Take 1 tablet (50 mcg total) by mouth daily.  Dispense: 90 tablet; Refill: 1 - Thyroid Panel With TSH  4. Recurrent major depressive disorder, in full remission ([place]) Stress management - DULoxetine (CYMBALTA) 30 MG capsule; Take 1 capsule (30 mg total) by mouth daily.  Dispense: 90 capsule; Refill: 1  5. GAD (generalized anxiety disorder)  6. Osteopenia of lumbar spine Weight bearing exercises encouraged  7. Morbid obesity ([place]) Discussed diet and exercise for person with BMI >25 Will recheck weight in 3-6 months    Labs pending Health Maintenance reviewed Diet and exercise encouraged  Follow up plan: 6 months   [name] Done, FNP

## 2019-11-30 NOTE — Patient Instructions (Signed)

## 2019-12-01 LAB — LIPID PANEL
Chol/HDL Ratio: 2.2 ratio (ref 0.0–4.4)
Cholesterol, Total: 155 mg/dL (ref 100–199)
HDL: 71 mg/dL (ref >39)
LDL Chol Calc (NIH): 63 mg/dL (ref 0–99)
Triglycerides: 120 mg/dL (ref 0–149)
VLDL Cholesterol Cal: 21 mg/dL (ref 5–40)

## 2019-12-01 LAB — CBC WITH DIFFERENTIAL/PLATELET
Basophils Absolute: 0.1 10*3/uL (ref 0.0–0.2)
Basos: 1 %
EOS (ABSOLUTE): 0.1 10*3/uL (ref 0.0–0.4)
Eos: 2 %
Hematocrit: 46.3 % (ref 34.0–46.6)
Hemoglobin: 15.5 g/dL (ref 11.1–15.9)
Immature Grans (Abs): 0 10*3/uL (ref 0.0–0.1)
Immature Granulocytes: 0 %
Lymphocytes Absolute: 1.7 10*3/uL (ref 0.7–3.1)
Lymphs: 34 %
MCH: 30.1 pg (ref 26.6–33.0)
MCHC: 33.5 g/dL (ref 31.5–35.7)
MCV: 90 fL (ref 79–97)
Monocytes Absolute: 0.4 10*3/uL (ref 0.1–0.9)
Monocytes: 9 %
Neutrophils Absolute: 2.6 10*3/uL (ref 1.4–7.0)
Neutrophils: 54 %
Platelets: 215 10*3/uL (ref 150–450)
RBC: 5.15 x10E6/uL (ref 3.77–5.28)
RDW: 12.8 % (ref 11.7–15.4)
WBC: 4.9 10*3/uL (ref 3.4–10.8)

## 2019-12-01 LAB — CMP14+EGFR
ALT: 15 IU/L (ref 0–32)
AST: 16 IU/L (ref 0–40)
Albumin/Globulin Ratio: 1.7 (ref 1.2–2.2)
Albumin: 4.1 g/dL (ref 3.8–4.8)
Alkaline Phosphatase: 80 IU/L (ref 44–121)
BUN/Creatinine Ratio: 17 (ref 12–28)
BUN: 17 mg/dL (ref 8–27)
Bilirubin Total: 0.7 mg/dL (ref 0.0–1.2)
CO2: 25 mmol/L (ref 20–29)
Calcium: 10.5 mg/dL — ABNORMAL HIGH (ref 8.7–10.3)
Chloride: 101 mmol/L (ref 96–106)
Creatinine, Ser: 1.03 mg/dL — ABNORMAL HIGH (ref 0.57–1.00)
GFR calc Af Amer: 65 mL/min/{1.73_m2} (ref >59)
GFR calc non Af Amer: 56 mL/min/{1.73_m2} — ABNORMAL LOW (ref >59)
Globulin, Total: 2.4 g/dL (ref 1.5–4.5)
Glucose: 115 mg/dL — ABNORMAL HIGH (ref 65–99)
Potassium: 4.9 mmol/L (ref 3.5–5.2)
Sodium: 138 mmol/L (ref 134–144)
Total Protein: 6.5 g/dL (ref 6.0–8.5)

## 2019-12-01 LAB — THYROID PANEL WITH TSH
Free Thyroxine Index: 2.5 (ref 1.2–4.9)
T3 Uptake Ratio: 25 % (ref 24–39)
T4, Total: 10.1 ug/dL (ref 4.5–12.0)
TSH: 0.873 u[IU]/mL (ref 0.450–4.500)

## 2019-12-02 ENCOUNTER — Other Ambulatory Visit: Payer: Self-pay | Admitting: Nurse Practitioner

## 2019-12-02 DIAGNOSIS — Z1231 Encounter for screening mammogram for malignant neoplasm of breast: Secondary | ICD-10-CM

## 2019-12-30 ENCOUNTER — Encounter: Payer: Self-pay | Admitting: Nurse Practitioner

## 2020-01-07 ENCOUNTER — Other Ambulatory Visit: Payer: Self-pay

## 2020-01-07 ENCOUNTER — Ambulatory Visit
Admission: RE | Admit: 2020-01-07 | Discharge: 2020-01-07 | Disposition: A | Discharge status: Home / Self Care | Payer: Medicare Other | Source: Ambulatory Visit | Attending: Nurse Practitioner | Admitting: Nurse Practitioner

## 2020-01-07 DIAGNOSIS — Z1231 Encounter for screening mammogram for malignant neoplasm of breast: Secondary | ICD-10-CM

## 2020-02-07 ENCOUNTER — Ambulatory Visit
Admission: RE | Admit: 2020-02-07 | Discharge: 2020-02-07 | Disposition: A | Discharge status: Home / Self Care | Payer: Medicare Other | Source: Ambulatory Visit | Attending: Nurse Practitioner | Admitting: Nurse Practitioner

## 2020-02-07 ENCOUNTER — Other Ambulatory Visit: Payer: Self-pay

## 2020-02-07 IMAGING — MG DIGITAL SCREENING BILAT W/ TOMO W/ CAD
8 series · 8 of 24 positions shown · non-contrast
Comparison: Previous exam(s).

CLINICAL DATA: Screening.

EXAM:
DIGITAL SCREENING BILATERAL MAMMOGRAM WITH TOMO AND CAD

[L CC synth-2D]
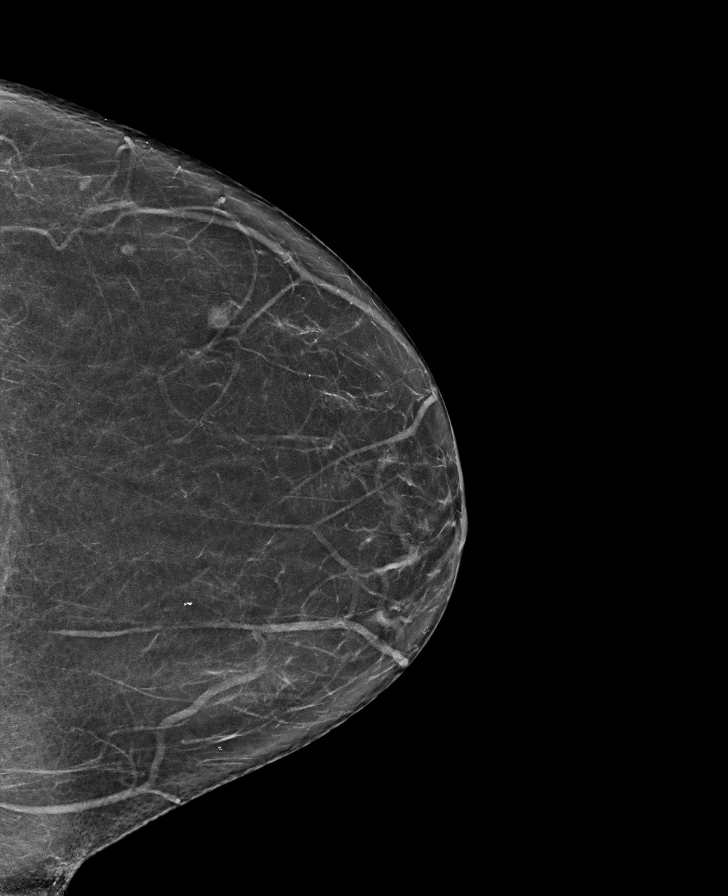

[R MLO synth-2D]
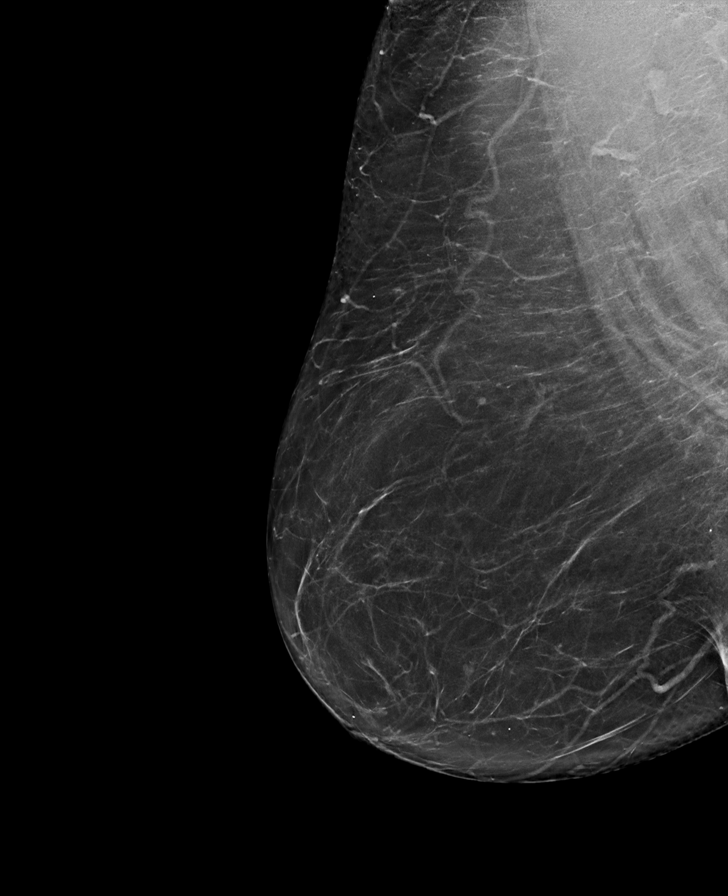

[R CC synth-2D]
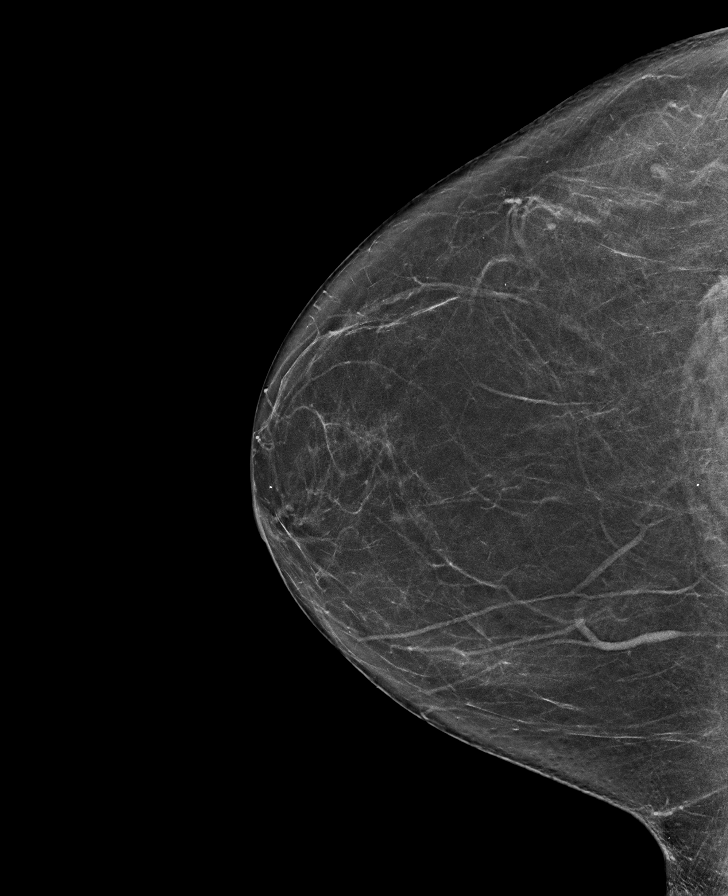

[L MLO synth-2D]
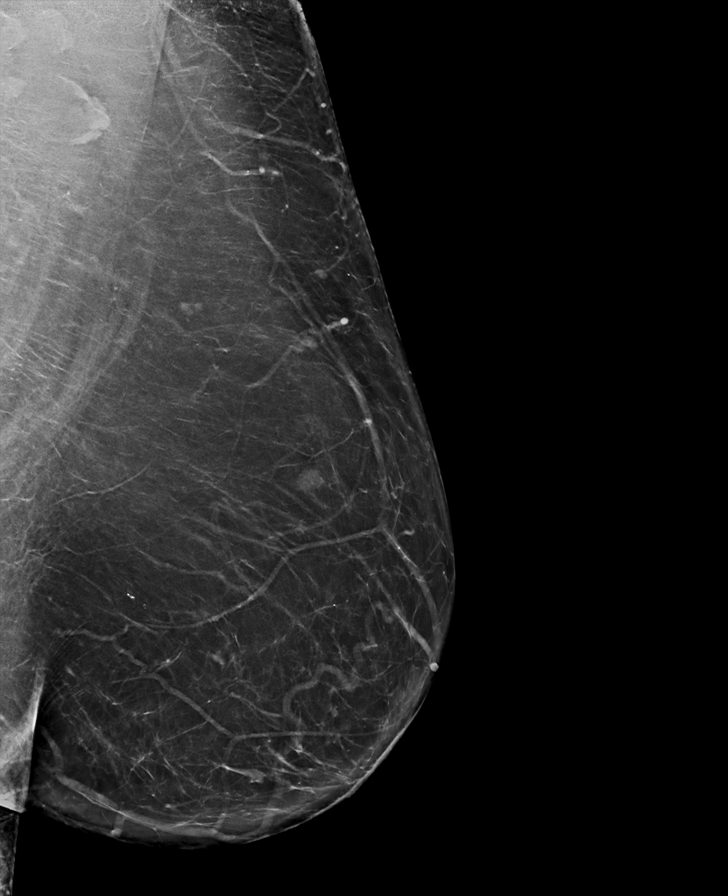

[R CC tomo · tomo slice 37/72.0]
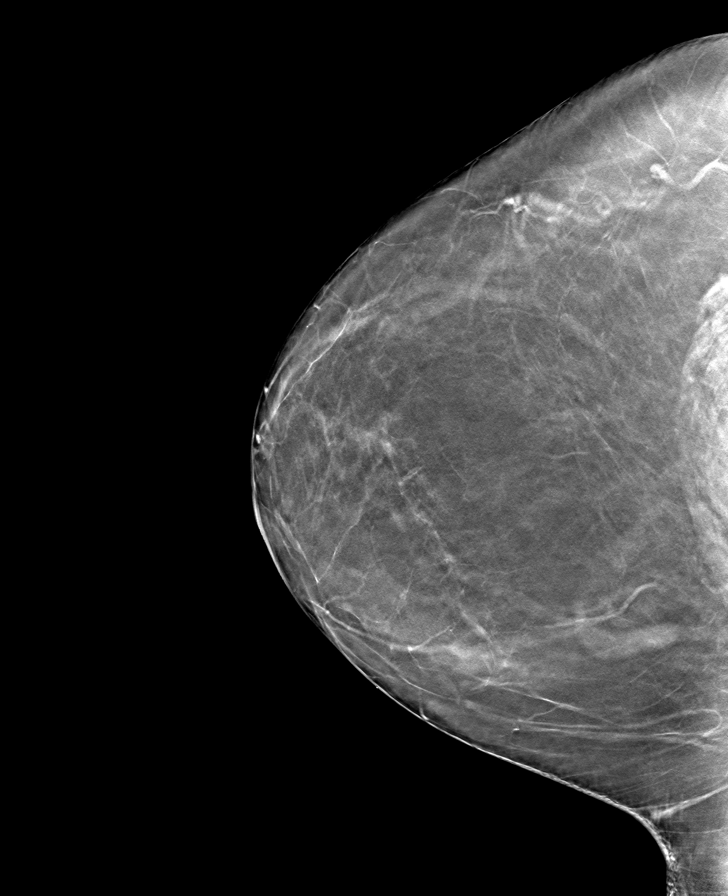

[L CC tomo · tomo slice 35/70.0]
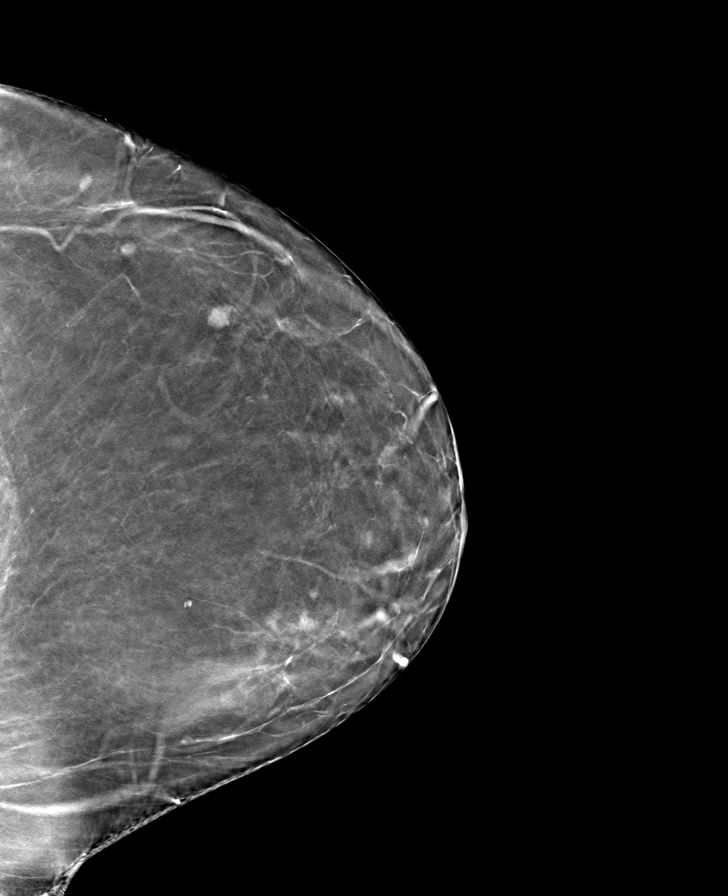

[R MLO tomo · tomo slice 40/79.0]
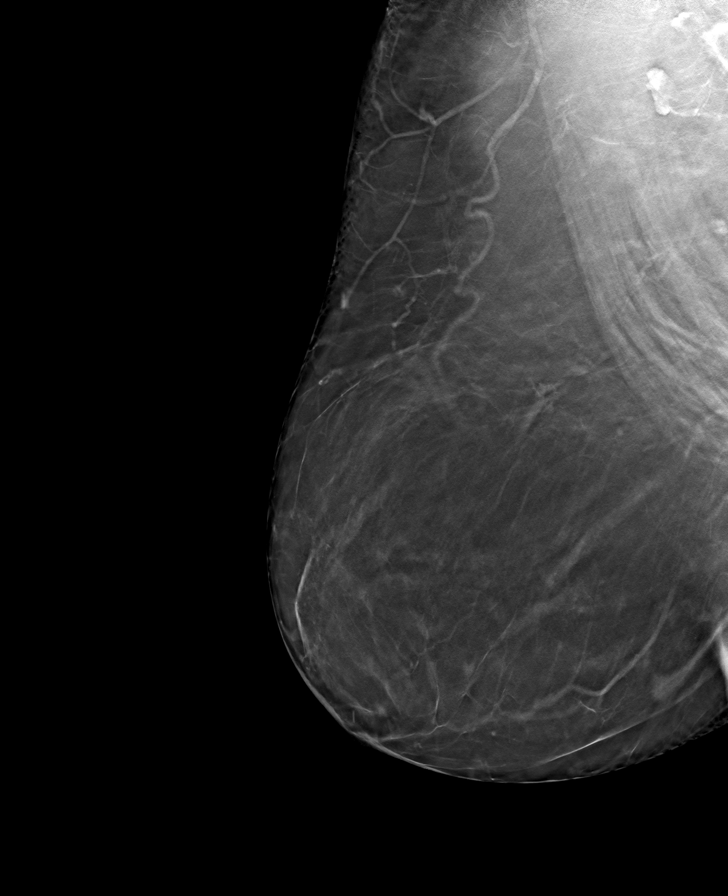

[L MLO tomo · tomo slice 43/84.0]
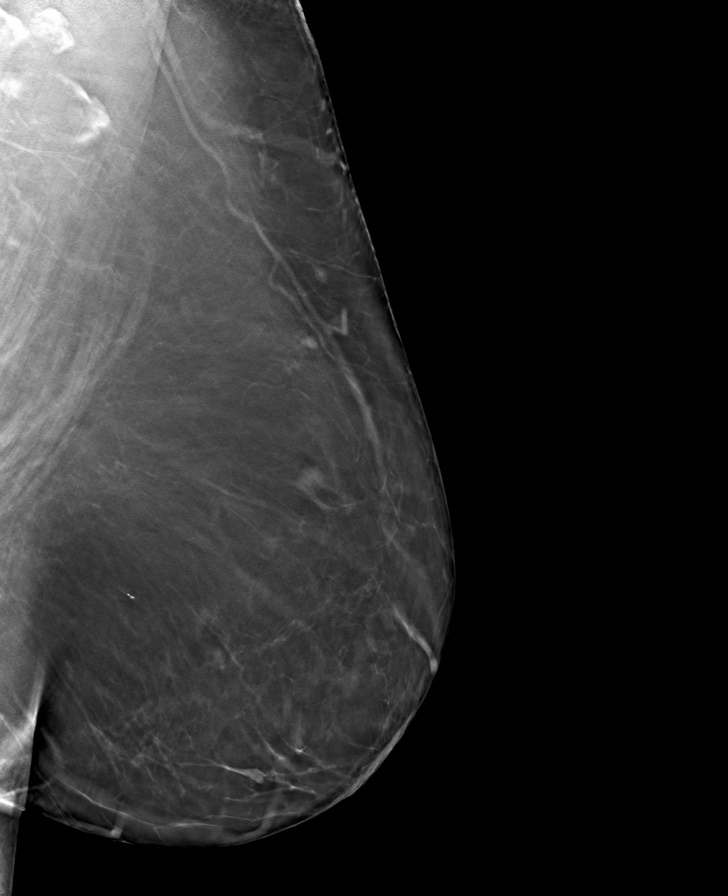

[8 of 24 positions shown; findings below may reference images not displayed]

ACR Breast Density Category b: There are scattered areas of
fibroglandular density.
FINDINGS: There are no findings suspicious for malignancy. Images were
processed with CAD.
IMPRESSION: No mammographic evidence of malignancy. A result letter of this
screening mammogram will be mailed directly to the patient.

RECOMMENDATION:
Screening mammogram in one year. (Code:[TQ])

BI-RADS CATEGORY  1: Negative.

## 2020-05-30 ENCOUNTER — Encounter: Payer: Self-pay | Admitting: Nurse Practitioner

## 2020-05-30 ENCOUNTER — Other Ambulatory Visit: Payer: Self-pay

## 2020-05-30 ENCOUNTER — Ambulatory Visit: Payer: Medicare Other | Admitting: Nurse Practitioner

## 2020-05-30 VITALS — BP 115/65 | HR 67 | Temp 97.4°F | Resp 20 | Ht 64.0 in | Wt 206.0 lb

## 2020-05-30 DIAGNOSIS — E890 Postprocedural hypothyroidism: Secondary | ICD-10-CM | POA: Diagnosis not present

## 2020-05-30 DIAGNOSIS — F411 Generalized anxiety disorder: Secondary | ICD-10-CM

## 2020-05-30 DIAGNOSIS — M8588 Other specified disorders of bone density and structure, other site: Secondary | ICD-10-CM

## 2020-05-30 DIAGNOSIS — I10 Essential (primary) hypertension: Secondary | ICD-10-CM | POA: Diagnosis not present

## 2020-05-30 DIAGNOSIS — E782 Mixed hyperlipidemia: Secondary | ICD-10-CM | POA: Diagnosis not present

## 2020-05-30 DIAGNOSIS — F3342 Major depressive disorder, recurrent, in full remission: Secondary | ICD-10-CM

## 2020-05-30 MED ORDER — DULOXETINE HCL 30 MG PO CPEP: 30.0000 mg | ORAL_CAPSULE | Freq: Every day | ORAL | 1 refills | Status: DC

## 2020-05-30 MED ORDER — LEVOTHYROXINE SODIUM 50 MCG PO TABS: 50.0000 ug | ORAL_TABLET | Freq: Every day | ORAL | 1 refills | Status: DC

## 2020-05-30 MED ORDER — LOSARTAN POTASSIUM 100 MG PO TABS: 100.0000 mg | ORAL_TABLET | Freq: Every day | ORAL | 1 refills | Status: DC

## 2020-05-30 MED ORDER — SIMVASTATIN 40 MG PO TABS: 40.0000 mg | ORAL_TABLET | Freq: Every day | ORAL | 1 refills | Status: DC

## 2020-05-30 NOTE — Patient Instructions (Signed)

## 2020-05-30 NOTE — Progress Notes (Signed)
Subjective:    Patient ID: Linda Hernandez, female    DOB: Sep 19, 1950, 70 y.o.   MRN: [id number]   Chief Complaint: Medical Management of Chronic Issues    HPI:  1. Primary hypertension Taking losartan. Tolerating well. No CP, COB, HA. No issues with low BP.   2. Mixed hyperlipidemia Taking simvastatin. Is tolerating well. Tries to watches diet, avoiding fatty foods.   Lab Results  Component Value Date   CHOL 155 11/30/2019   HDL 71 11/30/2019   LDLCALC 63 11/30/2019   TRIG 120 11/30/2019   CHOLHDL 2.2 11/30/2019    3. Postoperative hypothyroidism On synthroid.  Has lost a little hair recently.   Lab Results  Component Value Date   TSH 0.873 11/30/2019   4. GAD (generalized anxiety disorder) Taking cymbalta. Working well. No recent complaints  GAD 7 : Generalized Anxiety Score 05/30/2020 11/30/2019 05/25/2019 08/22/2017  Nervous, Anxious, on Edge 0 0 0 0  Control/stop worrying 0 0 0 0  Worry too much - different things 0 0 0 0  Trouble relaxing 0 0 0 0  Restless 0 0 0 0  Easily annoyed or irritable 0 0 0 1  Afraid - awful might happen 0 0 0 0  Total GAD 7 Score 0 0 0 1  Anxiety Difficulty Not difficult at all Somewhat difficult Not difficult at all Not difficult at all    5. Recurrent major depressive disorder, in full remission ([place]) Taking cymbalta. Working well.   PHQ9 SCORE ONLY 05/30/2020 11/30/2019 05/25/2019  PHQ-9 Total Score 0 0 0    6. Osteopenia of lumbar spine Last Dexascan on 11/2019. Taking Caltrate, Vit D. No issues with tolerating these medicines. Does not exercise regularly.    7. Morbid obesity ([place]) Tries to watch diet. Plans on increasing exercise.   Wt Readings from Last 3 Encounters:  05/30/20 206 lb (93.4 kg)  11/30/19 208 lb (94.3 kg)  05/25/19 212 lb (96.2 kg)     Outpatient Encounter Medications as of 05/30/2020  Medication Sig  . Ascorbic Acid (VITAMIN C) 100 MG tablet Take 100 mg by mouth daily.  . Calcium Carbonate (CALTRATE  600 PO) Take by mouth.  . Cholecalciferol (VITAMIN D) 2000 units tablet Take 2,000 Units by mouth daily.   . cyanocobalamin 1000 MCG tablet Take 1,000 mcg by mouth daily.  . DULoxetine (CYMBALTA) 30 MG capsule Take 1 capsule (30 mg total) by mouth daily.  . fish oil-omega-3 fatty acids 1000 MG capsule Take 2 g by mouth daily.  Marland Kitchen levothyroxine (SYNTHROID) 50 MCG tablet Take 1 tablet (50 mcg total) by mouth daily.  Marland Kitchen losartan (COZAAR) 100 MG tablet Take 1 tablet (100 mg total) by mouth daily.  . simvastatin (ZOCOR) 40 MG tablet Take 1 tablet (40 mg total) by mouth daily at 6 PM.   Facility-Administered Encounter Medications as of 05/30/2020  Medication  . 0.9 %  sodium chloride infusion    Past Surgical History:  Procedure Laterality Date  . ABDOMINAL HYSTERECTOMY    . COLONOSCOPY    . DENTAL RESTORATION/EXTRACTION WITH X-RAY    . DILATION AND CURETTAGE OF UTERUS    . MOHS SURGERY    . 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 Diabetes Mother        pre diabetic  per patient  . Heart disease Father   .  Diabetes Sister   . Down syndrome Sister   . Diabetes Brother   . Diabetes Brother   . Colon cancer Paternal Uncle        78's  . Colon cancer Paternal Grandmother        40's  . Breast cancer Cousin   . Esophageal cancer Neg Hx   . Rectal cancer Neg Hx   . Stomach cancer Neg Hx     New complaints: None.   Social history: Lives with husband. They are both retired.   Controlled substance contract: N/A   Review of Systems  Constitutional: Negative for chills, fatigue and fever.  HENT: Negative for congestion.   Eyes: Negative for redness and itching.  Respiratory: Negative for cough, shortness of breath and wheezing.   Cardiovascular: Negative for chest pain and palpitations.  Gastrointestinal: Negative for abdominal distention, abdominal pain, constipation and diarrhea.  Genitourinary:  Negative for difficulty urinating, dysuria and hematuria.  Musculoskeletal: Negative for back pain and joint swelling.  Neurological: Negative for dizziness, light-headedness and headaches.  Psychiatric/Behavioral: Negative for behavioral problems. The patient is not nervous/anxious.        Objective:   Physical Exam Constitutional:      Appearance: Normal appearance.  Neck:     Vascular: No carotid bruit.  Cardiovascular:     Rate and Rhythm: Normal rate and regular rhythm.     Pulses: Normal pulses.  Pulmonary:     Effort: Pulmonary effort is normal.     Breath sounds: Normal breath sounds.  Abdominal:     General: Bowel sounds are normal.     Palpations: Abdomen is soft.  Musculoskeletal:        General: Normal range of motion.     Cervical back: Normal range of motion and neck supple.  Skin:    General: Skin is warm and dry.     Capillary Refill: Capillary refill takes less than 2 seconds.  Neurological:     Mental Status: She is alert and oriented to person, place, and time.  Psychiatric:        Mood and Affect: Mood normal.        Behavior: Behavior normal.        Thought Content: Thought content normal.        Judgment: Judgment normal.    Vitals:   05/30/20 1004  BP: 115/65  Pulse: 67  Resp: 20  Temp: (!) 97.4 F (36.3 C)       Assessment & Plan:   Linda Hernandez comes in today with chief complaint of Medical Management of Chronic Issues   Diagnosis and orders addressed:  1. Primary hypertension Continue Losartan as prescribed. Monitor BP at home.   2. Mixed hyperlipidemia Continue medication as prescribed. Low fat diet, regular exercise.   - simvastatin (ZOCOR) 40 MG tablet; Take 1 tablet (40 mg total) by mouth daily at 6 PM.  Dispense: 90 tablet; Refill: 1  3. Postoperative hypothyroidism Continue levothyroxine.  - levothyroxine (SYNTHROID) 50 MCG tablet; Take 1 tablet (50 mcg total) by mouth daily.  Dispense: 90 tablet; Refill: 1  4. GAD  (generalized anxiety disorder) Continue Cymbalta. Alert the office if you have any changes in your anxiety levels.   5. Recurrent major depressive disorder, in full remission ([place]) Continue Cymbalta.  - DULoxetine (CYMBALTA) 30 MG capsule; Take 1 capsule (30 mg total) by mouth daily.  Dispense: 90 capsule; Refill: 1  6. Osteopenia of lumbar spine We discussed the importance of  weight bearing exercise, like walking and light hand weights, to help with stability, slow progression of disease, and protection of bone from fractures. She would like to start incorporating this more during the week. She will continue her calcium and vit d supplements.   7. Morbid obesity ([place]) Incorporating regular exercise should help with this. Weight is stable at this time.   8. Essential hypertension  - losartan (COZAAR) 100 MG tablet; Take 1 tablet (100 mg total) by mouth daily.  Dispense: 90 tablet; Refill: 1  Orders Placed This Encounter  Procedures  . CBC with Differential/Platelet  . CMP14+EGFR  . Lipid panel  . Thyroid Panel With TSH    Labs pending Health Maintenance reviewed Diet and exercise encouraged  Follow up plan: 6 months  Linda Primrose, RN, BSN, FNP-Student  Linda Hassell Done, FNP

## 2020-05-31 LAB — CBC WITH DIFFERENTIAL/PLATELET
Basophils Absolute: 0.1 10*3/uL (ref 0.0–0.2)
Basos: 1 %
EOS (ABSOLUTE): 0.1 10*3/uL (ref 0.0–0.4)
Eos: 2 %
Hematocrit: 46.7 % — ABNORMAL HIGH (ref 34.0–46.6)
Hemoglobin: 15.9 g/dL (ref 11.1–15.9)
Immature Grans (Abs): 0 10*3/uL (ref 0.0–0.1)
Immature Granulocytes: 0 %
Lymphocytes Absolute: 1.8 10*3/uL (ref 0.7–3.1)
Lymphs: 34 %
MCH: 30.4 pg (ref 26.6–33.0)
MCHC: 34 g/dL (ref 31.5–35.7)
MCV: 89 fL (ref 79–97)
Monocytes Absolute: 0.5 10*3/uL (ref 0.1–0.9)
Monocytes: 9 %
Neutrophils Absolute: 2.8 10*3/uL (ref 1.4–7.0)
Neutrophils: 54 %
Platelets: 238 10*3/uL (ref 150–450)
RBC: 5.23 x10E6/uL (ref 3.77–5.28)
RDW: 12.7 % (ref 11.7–15.4)
WBC: 5.2 10*3/uL (ref 3.4–10.8)

## 2020-05-31 LAB — THYROID PANEL WITH TSH
Free Thyroxine Index: 3.1 (ref 1.2–4.9)
T3 Uptake Ratio: 27 % (ref 24–39)
T4, Total: 11.5 ug/dL (ref 4.5–12.0)
TSH: 0.67 u[IU]/mL (ref 0.450–4.500)

## 2020-05-31 LAB — LIPID PANEL
Chol/HDL Ratio: 2 ratio (ref 0.0–4.4)
Cholesterol, Total: 151 mg/dL (ref 100–199)
HDL: 74 mg/dL (ref >39)
LDL Chol Calc (NIH): 53 mg/dL (ref 0–99)
Triglycerides: 140 mg/dL (ref 0–149)
VLDL Cholesterol Cal: 24 mg/dL (ref 5–40)

## 2020-05-31 LAB — CMP14+EGFR
ALT: 15 IU/L (ref 0–32)
AST: 22 IU/L (ref 0–40)
Albumin/Globulin Ratio: 1.7 (ref 1.2–2.2)
Albumin: 4.4 g/dL (ref 3.8–4.8)
Alkaline Phosphatase: 87 IU/L (ref 44–121)
BUN/Creatinine Ratio: 14 (ref 12–28)
BUN: 14 mg/dL (ref 8–27)
Bilirubin Total: 0.9 mg/dL (ref 0.0–1.2)
CO2: 22 mmol/L (ref 20–29)
Calcium: 10.8 mg/dL — ABNORMAL HIGH (ref 8.7–10.3)
Chloride: 101 mmol/L (ref 96–106)
Creatinine, Ser: 1 mg/dL (ref 0.57–1.00)
Globulin, Total: 2.6 g/dL (ref 1.5–4.5)
Glucose: 127 mg/dL — ABNORMAL HIGH (ref 65–99)
Potassium: 5.1 mmol/L (ref 3.5–5.2)
Sodium: 140 mmol/L (ref 134–144)
Total Protein: 7 g/dL (ref 6.0–8.5)
eGFR: 61 mL/min/{1.73_m2} (ref >59)

## 2020-12-04 ENCOUNTER — Ambulatory Visit: Payer: Medicare Other | Admitting: Nurse Practitioner

## 2020-12-04 ENCOUNTER — Encounter: Payer: Self-pay | Admitting: Nurse Practitioner

## 2020-12-04 ENCOUNTER — Other Ambulatory Visit: Payer: Self-pay

## 2020-12-04 VITALS — BP 134/85 | HR 82 | Temp 98.0°F | Ht 64.0 in | Wt 204.6 lb

## 2020-12-04 DIAGNOSIS — E782 Mixed hyperlipidemia: Secondary | ICD-10-CM

## 2020-12-04 DIAGNOSIS — E890 Postprocedural hypothyroidism: Secondary | ICD-10-CM

## 2020-12-04 DIAGNOSIS — I10 Essential (primary) hypertension: Secondary | ICD-10-CM | POA: Diagnosis not present

## 2020-12-04 DIAGNOSIS — M8588 Other specified disorders of bone density and structure, other site: Secondary | ICD-10-CM | POA: Diagnosis not present

## 2020-12-04 DIAGNOSIS — F3342 Major depressive disorder, recurrent, in full remission: Secondary | ICD-10-CM

## 2020-12-04 DIAGNOSIS — Z23 Encounter for immunization: Secondary | ICD-10-CM | POA: Diagnosis not present

## 2020-12-04 DIAGNOSIS — F411 Generalized anxiety disorder: Secondary | ICD-10-CM

## 2020-12-04 MED ORDER — SIMVASTATIN 40 MG PO TABS: 40.0000 mg | ORAL_TABLET | Freq: Every day | ORAL | 1 refills | Status: DC

## 2020-12-04 MED ORDER — DULOXETINE HCL 30 MG PO CPEP: 30.0000 mg | ORAL_CAPSULE | Freq: Every day | ORAL | 1 refills | Status: DC

## 2020-12-04 MED ORDER — LOSARTAN POTASSIUM 100 MG PO TABS: 100.0000 mg | ORAL_TABLET | Freq: Every day | ORAL | 1 refills | Status: DC

## 2020-12-04 MED ORDER — LEVOTHYROXINE SODIUM 50 MCG PO TABS: 50.0000 ug | ORAL_TABLET | Freq: Every day | ORAL | 1 refills | Status: DC

## 2020-12-04 NOTE — Progress Notes (Signed)
Subjective:    Patient ID: Linda Hernandez, female    DOB: 05-Mar-1951, 70 y.o.   MRN: [id number]   Chief Complaint: medical management of chronic issues     HPI:  1. Primary hypertension No c/o chest pain, sob or headache. Does not check blood pressure at home. BP Readings from Last 3 Encounters:  05/30/20 115/65  11/30/19 132/69  05/25/19 131/70     2. Mixed hyperlipidemia Does try to watch diet but does no dedicated exercise Lab Results  Component Value Date   CHOL 151 05/30/2020   HDL 74 05/30/2020   LDLCALC 53 05/30/2020   TRIG 140 05/30/2020   CHOLHDL 2.0 05/30/2020     3. Postoperative hypothyroidism No problems that she is aware of. Lab Results  Component Value Date   TSH 0.670 05/30/2020     4. Osteopenia of lumbar spine Last dexascan was done on 11/30/19. Her t score was -1.1. she doe so weightbearing exercise  5. Recurrent major depressive disorder, in full remission ([place]) Is on cymbalta and is doing well.  Depression screen [facility] 2/9 12/04/2020 05/30/2020 11/30/2019  Decreased Interest 0 0 0  Down, Depressed, Hopeless 0 0 0  PHQ - 2 Score 0 0 0  Altered sleeping - 0 0  Tired, decreased energy - 0 0  Change in appetite - 0 0  Feeling bad or failure about yourself  - 0 0  Trouble concentrating - 0 0  Moving slowly or fidgety/restless - 0 0  Suicidal thoughts - 0 0  PHQ-9 Score - 0 0  Difficult doing work/chores - Not difficult at all Not difficult at all  Some recent data might be hidden     6. GAD (generalized anxiety disorder) Cymbalta helps. GAD 7 : Generalized Anxiety Score 12/04/2020 05/30/2020 11/30/2019 05/25/2019  Nervous, Anxious, on Edge 0 0 0 0  Control/stop worrying 0 0 0 0  Worry too much - different things 0 0 0 0  Trouble relaxing 0 0 0 0  Restless 0 0 0 0  Easily annoyed or irritable 0 0 0 0  Afraid - awful might happen 0 0 0 0  Total GAD 7 Score 0 0 0 0  Anxiety Difficulty Not difficult at all Not difficult at all Somewhat  difficult Not difficult at all      7. Morbid obesity ([place]) No recent weight changes Wt Readings from Last 3 Encounters:  12/04/20 204 lb 9.6 oz (92.8 kg)  05/30/20 206 lb (93.4 kg)  11/30/19 208 lb (94.3 kg)   BMI Readings from Last 3 Encounters:  12/04/20 35.12 kg/m  05/30/20 35.36 kg/m  11/30/19 35.70 kg/m      Outpatient Encounter Medications as of 12/04/2020  Medication Sig   Ascorbic Acid (VITAMIN C) 100 MG tablet Take 100 mg by mouth daily.   Calcium Carbonate (CALTRATE 600 PO) Take by mouth.   Cholecalciferol (VITAMIN D) 2000 units tablet Take 2,000 Units by mouth daily.    cyanocobalamin 1000 MCG tablet Take 1,000 mcg by mouth daily.   DULoxetine (CYMBALTA) 30 MG capsule Take 1 capsule (30 mg total) by mouth daily.   fish oil-omega-3 fatty acids 1000 MG capsule Take 2 g by mouth daily.   levothyroxine (SYNTHROID) 50 MCG tablet Take 1 tablet (50 mcg total) by mouth daily.   losartan (COZAAR) 100 MG tablet Take 1 tablet (100 mg total) by mouth daily.   simvastatin (ZOCOR) 40 MG tablet Take 1 tablet (40 mg total) by mouth  daily at 6 PM.   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  Eyes:  Negative for pain.  Respiratory:  Negative for shortness of breath.   Cardiovascular:  Negative for chest pain, palpitations and leg swelling.  Gastrointestinal:  Negative for abdominal pain.  Endocrine: Negative for polydipsia.  Skin:  Negative for rash.  Neurological:  Negative for dizziness, weakness and headaches.  Hematological:  Does not bruise/bleed easily.  All other systems reviewed and are negative.     Objective:   Physical Exam Vitals and nursing note reviewed.  Constitutional:      General: She is not in acute distress.    Appearance: Normal appearance. She is well-developed.  HENT:     Head: Normocephalic.     Right Ear: Tympanic membrane normal.     Left Ear: Tympanic membrane normal.     Nose: Nose normal.     Mouth/Throat:     Mouth: Mucous membranes are moist.  Eyes:     Pupils: Pupils are equal, round, and reactive to light.  Neck:     Vascular: No carotid bruit or JVD.  Cardiovascular:     Rate and Rhythm: Normal rate and regular rhythm.     Heart sounds: Normal heart sounds.  Pulmonary:     Effort: Pulmonary effort is normal. No respiratory distress.     Breath sounds: Normal breath sounds. No wheezing or rales.  Chest:     Chest wall: No tenderness.  Abdominal:     General: Bowel sounds are normal. There is no distension or abdominal bruit.     Palpations: Abdomen is soft. There is no hepatomegaly, splenomegaly, mass or pulsatile mass.     Tenderness: There is no abdominal tenderness.  Musculoskeletal:        General: Normal range of motion.     Cervical back: Normal range of motion and neck supple.  Lymphadenopathy:     Cervical: No cervical adenopathy.  Skin:    General: Skin is warm and dry.  Neurological:     Mental Status: She is alert and oriented to person, place, and time.     Deep Tendon Reflexes:  Reflexes are normal and symmetric.  Psychiatric:        Behavior: Behavior  normal.        Thought Content: Thought content normal.        Judgment: Judgment normal.    BP 134/85   Pulse 82   Temp 98 F (36.7 C) (Temporal)   Ht _0  (1.626 m)   Wt 204 lb 9.6 oz (92.8 kg)   LMP 06/01/1997   SpO2 96%   BMI 35.12 kg/m        Assessment & Plan:   Marji A Kalmbach comes in today with chief complaint of Medical Management of Chronic Issues   Diagnosis and orders addressed:  1. Primary hypertension Low sodium diet - losartan (COZAAR) 100 MG tablet; Take 1 tablet (100 mg total) by mouth daily.  Dispense: 90 tablet; Refill: 1 - CBC with Differential/Platelet - CMP14+EGFR  2. Mixed hyperlipidemia Low fat diet - simvastatin (ZOCOR) 40 MG tablet; Take 1 tablet (40 mg total) by mouth daily at 6 PM.  Dispense: 90 tablet; Refill: 1 - Lipid panel  3. Postoperative hypothyroidism Labs pending - levothyroxine (SYNTHROID) 50 MCG tablet; Take 1 tablet (50 mcg total) by mouth daily.  Dispense: 90 tablet; Refill: 1 - Thyroid Panel With TSH  4. Osteopenia of lumbar spine Weight bearing exercises encouraged  5. Recurrent major depressive disorder, in full remission ([place]) Stress management - DULoxetine (CYMBALTA) 30 MG capsule; Take 1 capsule (30 mg total) by mouth daily.  Dispense: 90 capsule; Refill: 1  6. GAD (generalized anxiety disorder) Stress management  7. Morbid obesity ([place]) Discussed diet and exercise for person with BMI >25 Will recheck weight in 3-6 months   8. Need for immunization against influenza - Flu Vaccine QUAD High Dose(Fluad)   Labs pending Health Maintenance reviewed Diet and exercise encouraged  Follow up plan: 6 months   [name] Done, FNP

## 2020-12-04 NOTE — Patient Instructions (Signed)

## 2020-12-05 LAB — CMP14+EGFR
ALT: 13 IU/L (ref 0–32)
AST: 18 IU/L (ref 0–40)
Albumin/Globulin Ratio: 1.8 (ref 1.2–2.2)
Albumin: 4.3 g/dL (ref 3.8–4.8)
Alkaline Phosphatase: 89 IU/L (ref 44–121)
BUN/Creatinine Ratio: 14 (ref 12–28)
BUN: 14 mg/dL (ref 8–27)
Bilirubin Total: 1.1 mg/dL (ref 0.0–1.2)
CO2: 23 mmol/L (ref 20–29)
Calcium: 10.8 mg/dL — ABNORMAL HIGH (ref 8.7–10.3)
Chloride: 102 mmol/L (ref 96–106)
Creatinine, Ser: 0.97 mg/dL (ref 0.57–1.00)
Globulin, Total: 2.4 g/dL (ref 1.5–4.5)
Glucose: 132 mg/dL — ABNORMAL HIGH (ref 70–99)
Potassium: 5.1 mmol/L (ref 3.5–5.2)
Sodium: 140 mmol/L (ref 134–144)
Total Protein: 6.7 g/dL (ref 6.0–8.5)
eGFR: 64 mL/min/{1.73_m2} (ref >59)

## 2020-12-05 LAB — THYROID PANEL WITH TSH
Free Thyroxine Index: 3.3 (ref 1.2–4.9)
T3 Uptake Ratio: 28 % (ref 24–39)
T4, Total: 11.8 ug/dL (ref 4.5–12.0)
TSH: 0.607 u[IU]/mL (ref 0.450–4.500)

## 2020-12-05 LAB — LIPID PANEL
Chol/HDL Ratio: 2.1 ratio (ref 0.0–4.4)
Cholesterol, Total: 150 mg/dL (ref 100–199)
HDL: 70 mg/dL (ref >39)
LDL Chol Calc (NIH): 58 mg/dL (ref 0–99)
Triglycerides: 129 mg/dL (ref 0–149)
VLDL Cholesterol Cal: 22 mg/dL (ref 5–40)

## 2020-12-05 LAB — CBC WITH DIFFERENTIAL/PLATELET
Basophils Absolute: 0.1 10*3/uL (ref 0.0–0.2)
Basos: 1 %
EOS (ABSOLUTE): 0.1 10*3/uL (ref 0.0–0.4)
Eos: 1 %
Hematocrit: 47.8 % — ABNORMAL HIGH (ref 34.0–46.6)
Hemoglobin: 15.8 g/dL (ref 11.1–15.9)
Immature Grans (Abs): 0 10*3/uL (ref 0.0–0.1)
Immature Granulocytes: 0 %
Lymphocytes Absolute: 1.2 10*3/uL (ref 0.7–3.1)
Lymphs: 13 %
MCH: 29.4 pg (ref 26.6–33.0)
MCHC: 33.1 g/dL (ref 31.5–35.7)
MCV: 89 fL (ref 79–97)
Monocytes Absolute: 0.6 10*3/uL (ref 0.1–0.9)
Monocytes: 7 %
Neutrophils Absolute: 7.3 10*3/uL — ABNORMAL HIGH (ref 1.4–7.0)
Neutrophils: 78 %
Platelets: 210 10*3/uL (ref 150–450)
RBC: 5.37 x10E6/uL — ABNORMAL HIGH (ref 3.77–5.28)
RDW: 12.4 % (ref 11.7–15.4)
WBC: 9.3 10*3/uL (ref 3.4–10.8)

## 2020-12-14 ENCOUNTER — Ambulatory Visit (INDEPENDENT_AMBULATORY_CARE_PROVIDER_SITE_OTHER): Payer: Medicare Other

## 2020-12-14 VITALS — Ht 64.0 in | Wt 205.0 lb

## 2020-12-14 DIAGNOSIS — Z1231 Encounter for screening mammogram for malignant neoplasm of breast: Secondary | ICD-10-CM | POA: Diagnosis not present

## 2020-12-14 DIAGNOSIS — Z0000 Encounter for general adult medical examination without abnormal findings: Secondary | ICD-10-CM | POA: Diagnosis not present

## 2020-12-14 NOTE — Progress Notes (Signed)
Subjective:   Linda Hernandez is a 70 y.o. female who presents for Medicare Annual (Subsequent) preventive examination.  Virtual Visit via Telephone Note  I connected with  Solana A Callegari on 12/14/20 at  9:00 AM EDT by telephone and verified that I am speaking with the correct person using two identifiers.  Location: Patient: Home Provider: [facility] Persons participating in the virtual visit: patient/Nurse Health Advisor   I discussed the limitations, risks, security and privacy concerns of performing an evaluation and management service by telephone and the availability of in person appointments. The patient expressed understanding and agreed to proceed.  Interactive audio and video telecommunications were attempted between this nurse and patient, however failed, due to patient having technical difficulties OR patient did not have access to video capability.  We continued and completed visit with audio only.  Some vital signs may be absent or patient reported.   Breda Bond E Tyeesha Riker, LPN   Review of Systems     Cardiac Risk Factors include: advanced age (>49men, >35 women);dyslipidemia;hypertension;obesity (BMI >30kg/m2);sedentary lifestyle     Objective:    Today's Vitals   12/14/20 0903  Weight: 205 lb (93 kg)  Height: 5\' 4"  (1.626 m)   Body mass index is 35.19 kg/m.  Advanced Directives 12/14/2020 09/18/2018 06/19/2016 06/05/2016  Does Patient Have a Medical Advance Directive? Yes No No No  Type of Paramedic of [place];Living will - - -  Copy of [place] in Chart? No - copy requested - - -  Would patient like information on creating a medical advance directive? - No - Patient declined - -    Current Medications (verified) Outpatient Encounter Medications as of 12/14/2020  Medication Sig   Ascorbic Acid (VITAMIN C) 100 MG tablet Take 100 mg by mouth daily.   Calcium Carbonate (CALTRATE 600 PO) Take by mouth.   Cholecalciferol (VITAMIN D)  2000 units tablet Take 2,000 Units by mouth daily.    cyanocobalamin 1000 MCG tablet Take 1,000 mcg by mouth daily.   DULoxetine (CYMBALTA) 30 MG capsule Take 1 capsule (30 mg total) by mouth daily.   fish oil-omega-3 fatty acids 1000 MG capsule Take 2 g by mouth daily.   levothyroxine (SYNTHROID) 50 MCG tablet Take 1 tablet (50 mcg total) by mouth daily.   losartan (COZAAR) 100 MG tablet Take 1 tablet (100 mg total) by mouth daily.   simvastatin (ZOCOR) 40 MG tablet Take 1 tablet (40 mg total) by mouth daily at 6 PM.   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     Family History  Problem Relation Age of Onset   Diabetes Mother        pre diabetic  per patient   Heart disease Father    Diabetes Sister    Down syndrome Sister    Diabetes Brother  Diabetes Brother    Colon cancer Paternal Uncle        59's   Colon cancer Paternal Grandmother        17's   Breast cancer Cousin    Esophageal cancer Neg Hx    Rectal cancer Neg Hx    Stomach cancer Neg Hx    Social History   Socioeconomic History   Marital status: Married    Spouse name: Elenore Rota   Number of children: 3   Years of education: 12   Highest education level: High school graduate  Occupational History   Occupation: retired  Tobacco Use   Smoking status: Never   Smokeless tobacco: Never  Vaping Use   Vaping Use: Never used  Substance and Sexual Activity   Alcohol use: Yes    Comment: extremely rare 2 times  yearly   Drug use: No   Sexual activity: Yes    Birth control/protection: Surgical  Other Topics Concern   Not on file  Social History Narrative   Lives in one level home with her husband - he is a Environmental education officer.   Son lives 13 miles away.   Social Determinants of Health   Financial Resource Strain: Low Risk    Difficulty of Paying Living Expenses: Not hard at all  Food Insecurity: No Food Insecurity   Worried About Charity fundraiser in the Last Year: Never true   [place] in the Last Year: Never true  Transportation Needs: No Transportation Needs   Lack of Transportation (Medical): No   Lack of Transportation (Non-Medical): No  Physical Activity: Inactive   Days of Exercise per Week: 0 days   Minutes of Exercise per Session: 0 min  Stress: No Stress Concern Present   Feeling of Stress : Not at all  Social Connections: Socially Integrated   Frequency of Communication with Friends and Family: More than three times a week   Frequency of Social Gatherings with Friends and Family: Twice a week   Attends Religious Services: More than 4 times per year   Active Member of [employer] or Organizations: Yes   Attends Music therapist: More than 4 times per year   Marital Status: Married    Tobacco Counseling Counseling given: Not Answered   Clinical Intake:  Pre-visit preparation completed: Yes  Pain : No/denies pain     BMI - recorded: 33.19 Nutritional Status: BMI > 30  Obese Nutritional Risks: None Diabetes: No  How often do you need to have someone help you when you read instructions, pamphlets, or other written materials from your doctor or pharmacy?: 1 - Never  Diabetic? No  Interpreter Needed?: No  Information entered by :: Merlina Marchena, LPN   Activities of Daily Living In your present state of health, do you have any difficulty performing the following activities: 12/14/2020  Hearing? N  Vision? N  Difficulty concentrating or making decisions? N   Walking or climbing stairs? N  Dressing or bathing? N  Doing errands, shopping? N  Preparing Food and eating ? N  Using the Toilet? N  In the past six months, have you accidently leaked urine? N  Do you have problems with loss of bowel control? N  Managing your Medications? N  Managing your Finances? N  Housekeeping or managing your Housekeeping? N  Some recent data might be hidden    Patient Care Team: Chevis Pretty, FNP as PCP - General (Nurse Practitioner)  Indicate any recent Medical Services  you may have received from other than [facility] providers in the past year (date may be approximate).     Assessment:   This is a routine wellness examination for Maira.  Hearing/Vision screen Hearing Screening - Comments:: Denies hearing difficulties  Vision Screening - Comments:: Wears rx glasses - up to date with annual eye exams with Dr Marin Comment in [place]  Dietary issues and exercise activities discussed: Exercise limited by: None identified   Goals Addressed             This Visit's Progress    DIET - INCREASE WATER INTAKE   Not on track    Try to drink 6-8 glasses of water daily.     Exercise 3x per week (30 min per time)         Depression Screen PHQ 2/9 Scores 12/14/2020 12/04/2020 05/30/2020 11/30/2019 05/25/2019 11/17/2018 09/18/2018  PHQ - 2 Score 0 0 0 0 0 0 0  PHQ- 9 Score - - 0 0 - 0 -    Fall Risk Fall Risk  12/14/2020 12/04/2020 05/30/2020 05/25/2019 11/17/2018  Falls in the past year? 0 0 0 0 0  Number falls in past yr: 0 - - - -  Injury with Fall? 0 - - - -  Risk for fall due to : No Fall Risks - - - -  Follow up Falls prevention discussed - - - -    FALL RISK PREVENTION PERTAINING TO THE HOME:  Any stairs in or around the home? No  If so, are there any without handrails? No  Home free of loose throw rugs in walkways, pet beds, electrical cords, etc? Yes  Adequate lighting in your home to reduce risk of falls? Yes   ASSISTIVE DEVICES UTILIZED TO PREVENT  FALLS:  Life alert? No  Use of a cane, walker or w/c? No  Grab bars in the bathroom? No  Shower chair or bench in shower? No  Elevated toilet seat or a handicapped toilet? No   TIMED UP AND GO:  Was the test performed? No . Telephonic visit  Cognitive Function: Normal cognitive status assessed by direct observation by this Nurse Health Advisor. No abnormalities found.   MMSE - Mini Mental State Exam 09/15/2017  Orientation to time 5  Orientation to Place 5  Registration 3  Attention/ Calculation 5  Recall 3  Language- name 2 objects 2  Language- repeat 1  Language- follow 3 step command 3  Language- read & follow direction 1  Write a sentence 1  Copy design 1  Total score 30     6CIT Screen 09/18/2018  What Year? 0 points  What month? 0 points  What time? 0 points  Count back from 20 2 points  Months in reverse 0 points  Repeat phrase 2 points  Total Score 4    Immunizations Immunization History  Administered Date(s) Administered   Fluad Quad(high Dose 65+) 11/17/2018, 11/30/2019, 12/04/2020   Influenza, High Dose Seasonal PF 01/12/2018   Influenza,inj,Quad PF,6+ Mos 12/27/2013, 01/13/2015, 12/23/2016   Influenza-Unspecified 12/13/2016   PFIZER(Purple Top)SARS-COV-2 Vaccination 04/25/2019, 05/18/2019, 12/29/2019, 06/28/2020   Pneumococcal Conjugate-13 04/29/2016, 11/17/2018   Pneumococcal Polysaccharide-23 08/22/2017   Tdap 12/27/2013   Zoster Recombinat (Shingrix) 09/15/2017, 02/24/2018    TDAP status: Up to date  Flu Vaccine status: Up to date  Pneumococcal vaccine status: Up to date  Covid-19 vaccine status: Completed vaccines  Qualifies for Shingles Vaccine? Yes   Zostavax completed Yes   Shingrix Completed?: Yes  Screening Tests Health Maintenance  Topic Date Due   COLONOSCOPY (Pts 45-7yrs Insurance coverage will need to be confirmed)  06/19/2021   DEXA SCAN  11/29/2021   MAMMOGRAM  02/06/2022   TETANUS/TDAP  12/28/2023   INFLUENZA VACCINE   Completed   COVID-19 Vaccine  Completed   Hepatitis C Screening  Completed   Zoster Vaccines- Shingrix  Completed   HPV VACCINES  Aged Out    Health Maintenance  There are no preventive care reminders to display for this patient.   Colorectal cancer screening: Type of screening: Colonoscopy. Completed 06/19/2016. Repeat every 5 years  Mammogram status: Completed 02/07/2020. Repeat every year  Bone Density status: Completed 11/30/2019. Results reflect: Bone density results: OSTEOPENIA. Repeat every 2 years.  Lung Cancer Screening: (Low Dose CT Chest recommended if Age 54-80 years, 30 pack-year currently smoking OR have quit w/in 15years.) does not qualify.  Additional Screening:  Hepatitis C Screening: does qualify; Completed 01/12/2015  Vision Screening: Recommended annual ophthalmology exams for early detection of glaucoma and other disorders of the eye. Is the patient up to date with their annual eye exam?  Yes  Who is the provider or what is the name of the office in which the patient attends annual eye exams? [facility] If pt is not established with a provider, would they like to be referred to a provider to establish care? No .   Dental Screening: Recommended annual dental exams for proper oral hygiene  Community Resource Referral / Chronic Care Management: CRR required this visit?  No   CCM required this visit?  No      Plan:     I have personally reviewed and noted the following in the patient's chart:   Medical and social history Use of alcohol, tobacco or illicit drugs  Current medications and supplements including opioid prescriptions.  Functional ability and status Nutritional status Physical activity Advanced directives List of other physicians Hospitalizations, surgeries, and ER visits in previous 12 months Vitals Screenings to include cognitive, depression, and falls Referrals and appointments  In addition, I have reviewed and discussed with patient  certain preventive protocols, quality metrics, and best practice recommendations. A written personalized care plan for preventive services as well as general preventive health recommendations were provided to patient.     Sandrea Hammond, LPN   93/03/1214   Nurse Notes: None

## 2020-12-14 NOTE — Patient Instructions (Signed)
Linda Hernandez , Thank you for taking time to come for your Medicare Wellness Visit. I appreciate your ongoing commitment to your health goals. Please review the following plan we discussed and let me know if I can assist you in the future.   Screening recommendations/referrals: Colonoscopy: Done 06/19/2016 - Repeat in 5 years Mammogram: Done 02/07/2020 - Repeat annually  Bone Density: Done 11/30/2019 - Repeat every 2 years Recommended yearly ophthalmology/optometry visit for glaucoma screening and checkup Recommended yearly dental visit for hygiene and checkup  Vaccinations: Influenza vaccine: Done 12/04/2020 - Repeat annually Pneumococcal vaccine: Done 08/22/2017 & 11/17/2018 Tdap vaccine: Done 12/27/2013 - Repeat in 10 years Shingles vaccine: Done 09/15/2017 & 02/24/2018   Covid-19:Done 04/25/2019, 05/18/2019, 12/29/2019, & ~06/28/2020  Advanced directives: Please bring a copy of your health care power of attorney and living will to the office to be added to your chart at your convenience.   Conditions/risks identified: Aim for 30 minutes of exercise or brisk walking each day, drink 6-8 glasses of water and eat lots of fruits and vegetables.   Next appointment: Follow up in one year for your annual wellness visit    Preventive Care 65 Years and Older, Female Preventive care refers to lifestyle choices and visits with your health care provider that can promote health and wellness. What does preventive care include? A yearly physical exam. This is also called an annual well check. Dental exams once or twice a year. Routine eye exams. Ask your health care provider how often you should have your eyes checked. Personal lifestyle choices, including: Daily care of your teeth and gums. Regular physical activity. Eating a healthy diet. Avoiding tobacco and drug use. Limiting alcohol use. Practicing safe sex. Taking low-dose aspirin every day. Taking vitamin and mineral supplements as recommended by  your health care provider. What happens during an annual well check? The services and screenings done by your health care provider during your annual well check will depend on your age, overall health, lifestyle risk factors, and family history of disease. Counseling  Your health care provider may ask you questions about your: Alcohol use. Tobacco use. Drug use. Emotional well-being. Home and relationship well-being. Sexual activity. Eating habits. History of falls. Memory and ability to understand (cognition). Work and work Statistician. Reproductive health. Screening  You may have the following tests or measurements: Height, weight, and BMI. Blood pressure. Lipid and cholesterol levels. These may be checked every 5 years, or more frequently if you are over 77 years old. Skin check. Lung cancer screening. You may have this screening every year starting at age 70 if you have a 30-pack-year history of smoking and currently smoke or have quit within the past 15 years. Fecal occult blood test (FOBT) of the stool. You may have this test every year starting at age 20. Flexible sigmoidoscopy or colonoscopy. You may have a sigmoidoscopy every 5 years or a colonoscopy every 10 years starting at age 30. Hepatitis C blood test. Hepatitis B blood test. Sexually transmitted disease (STD) testing. Diabetes screening. This is done by checking your blood sugar (glucose) after you have not eaten for a while (fasting). You may have this done every 1-3 years. Bone density scan. This is done to screen for osteoporosis. You may have this done starting at age 75. Mammogram. This may be done every 1-2 years. Talk to your health care provider about how often you should have regular mammograms. Talk with your health care provider about your test results, treatment options, and **Note De-Identified Boody Obfuscation** if necessary, the need for more tests. Vaccines  Your health care provider may recommend certain vaccines, such as: Influenza  vaccine. This is recommended every year. Tetanus, diphtheria, and acellular pertussis (Tdap, Td) vaccine. You may need a Td booster every 10 years. Zoster vaccine. You may need this after age 33. Pneumococcal 13-valent conjugate (PCV13) vaccine. One dose is recommended after age 90. Pneumococcal polysaccharide (PPSV23) vaccine. One dose is recommended after age 26. Talk to your health care provider about which screenings and vaccines you need and how often you need them. This information is not intended to replace advice given to you by your health care provider. Make sure you discuss any questions you have with your health care provider. Document Released: 03/24/2015 Document Revised: 11/15/2015 Document Reviewed: 12/27/2014 Elsevier Interactive Patient Education  2017 [employer] Prevention in the Home Falls can cause injuries. They can happen to people of all ages. There are many things you can do to make your home safe and to help prevent falls. What can I do on the outside of my home? Regularly fix the edges of walkways and driveways and fix any cracks. Remove anything that might make you trip as you walk through a door, such as a raised step or threshold. Trim any bushes or trees on the path to your home. Use bright outdoor lighting. Clear any walking paths of anything that might make someone trip, such as rocks or tools. Regularly check to see if handrails are loose or broken. Make sure that both sides of any steps have handrails. Any raised decks and porches should have guardrails on the edges. Have any leaves, snow, or ice cleared regularly. Use sand or salt on walking paths during winter. Clean up any spills in your garage right away. This includes oil or grease spills. What can I do in the bathroom? Use night lights. Install grab bars by the toilet and in the tub and shower. Do not use towel bars as grab bars. Use non-skid mats or decals in the tub or shower. If you  need to sit down in the shower, use a plastic, non-slip stool. Keep the floor dry. Clean up any water that spills on the floor as soon as it happens. Remove soap buildup in the tub or shower regularly. Attach bath mats securely with double-sided non-slip rug tape. Do not have throw rugs and other things on the floor that can make you trip. What can I do in the bedroom? Use night lights. Make sure that you have a light by your bed that is easy to reach. Do not use any sheets or blankets that are too big for your bed. They should not hang down onto the floor. Have a firm chair that has side arms. You can use this for support while you get dressed. Do not have throw rugs and other things on the floor that can make you trip. What can I do in the kitchen? Clean up any spills right away. Avoid walking on wet floors. Keep items that you use a lot in easy-to-reach places. If you need to reach something above you, use a strong step stool that has a grab bar. Keep electrical cords out of the way. Do not use floor polish or wax that makes floors slippery. If you must use wax, use non-skid floor wax. Do not have throw rugs and other things on the floor that can make you trip. What can I do with my stairs? Do not leave any  items on the stairs. Make sure that there are handrails on both sides of the stairs and use them. Fix handrails that are broken or loose. Make sure that handrails are as long as the stairways. Check any carpeting to make sure that it is firmly attached to the stairs. Fix any carpet that is loose or worn. Avoid having throw rugs at the top or bottom of the stairs. If you do have throw rugs, attach them to the floor with carpet tape. Make sure that you have a light switch at the top of the stairs and the bottom of the stairs. If you do not have them, ask someone to add them for you. What else can I do to help prevent falls? Wear shoes that: Do not have high heels. Have rubber  bottoms. Are comfortable and fit you well. Are closed at the toe. Do not wear sandals. If you use a stepladder: Make sure that it is fully opened. Do not climb a closed stepladder. Make sure that both sides of the stepladder are locked into place. Ask someone to hold it for you, if possible. Clearly mark and make sure that you can see: Any grab bars or handrails. First and last steps. Where the edge of each step is. Use tools that help you move around (mobility aids) if they are needed. These include: Canes. Walkers. Scooters. Crutches. Turn on the lights when you go into a dark area. Replace any light bulbs as soon as they burn out. Set up your furniture so you have a clear path. Avoid moving your furniture around. If any of your floors are uneven, fix them. If there are any pets around you, be aware of where they are. Review your medicines with your doctor. Some medicines can make you feel dizzy. This can increase your chance of falling. Ask your doctor what other things that you can do to help prevent falls. This information is not intended to replace advice given to you by your health care provider. Make sure you discuss any questions you have with your health care provider. Document Released: 12/22/2008 Document Revised: 08/03/2015 Document Reviewed: 04/01/2014 Elsevier Interactive Patient Education  2017 [employer].

## 2021-01-02 ENCOUNTER — Ambulatory Visit: Payer: Medicare Other | Admitting: Nurse Practitioner

## 2021-03-21 ENCOUNTER — Ambulatory Visit
Admission: RE | Admit: 2021-03-21 | Discharge: 2021-03-21 | Disposition: A | Discharge status: Home / Self Care | Payer: Medicare Other | Source: Ambulatory Visit | Attending: Nurse Practitioner | Admitting: Nurse Practitioner

## 2021-03-21 DIAGNOSIS — Z1231 Encounter for screening mammogram for malignant neoplasm of breast: Secondary | ICD-10-CM

## 2021-06-04 ENCOUNTER — Ambulatory Visit: Payer: Medicare Other | Admitting: Nurse Practitioner

## 2021-06-12 ENCOUNTER — Encounter: Payer: Self-pay | Admitting: Nurse Practitioner

## 2021-06-12 ENCOUNTER — Ambulatory Visit: Payer: Medicare Other | Admitting: Nurse Practitioner

## 2021-06-12 VITALS — BP 115/67 | HR 68 | Temp 97.4°F | Resp 20 | Ht 64.0 in | Wt 203.0 lb

## 2021-06-12 DIAGNOSIS — M8588 Other specified disorders of bone density and structure, other site: Secondary | ICD-10-CM

## 2021-06-12 DIAGNOSIS — E890 Postprocedural hypothyroidism: Secondary | ICD-10-CM

## 2021-06-12 DIAGNOSIS — I10 Essential (primary) hypertension: Secondary | ICD-10-CM | POA: Diagnosis not present

## 2021-06-12 DIAGNOSIS — E782 Mixed hyperlipidemia: Secondary | ICD-10-CM

## 2021-06-12 DIAGNOSIS — F3342 Major depressive disorder, recurrent, in full remission: Secondary | ICD-10-CM

## 2021-06-12 DIAGNOSIS — F411 Generalized anxiety disorder: Secondary | ICD-10-CM

## 2021-06-12 MED ORDER — SIMVASTATIN 40 MG PO TABS: 40.0000 mg | ORAL_TABLET | Freq: Every day | ORAL | 1 refills | Status: DC

## 2021-06-12 MED ORDER — LOSARTAN POTASSIUM 100 MG PO TABS: 100.0000 mg | ORAL_TABLET | Freq: Every day | ORAL | 1 refills | Status: DC

## 2021-06-12 MED ORDER — LEVOTHYROXINE SODIUM 50 MCG PO TABS: 50.0000 ug | ORAL_TABLET | Freq: Every day | ORAL | 1 refills | Status: DC

## 2021-06-12 MED ORDER — DULOXETINE HCL 30 MG PO CPEP: 30.0000 mg | ORAL_CAPSULE | Freq: Every day | ORAL | 1 refills | Status: DC

## 2021-06-12 NOTE — Progress Notes (Signed)
? ?Subjective:  ? ? Patient ID: Linda Hernandez, female    DOB: 1950/04/10, 71 y.o.   MRN: [id number] ? ?Chief Complaint: medical management of chronic issues  ?  ? ?HPI: ? ?Linda Hernandez is a 71 y.o. who identifies as a female who was assigned female at birth.  ? ?Social history: ?Lives with: husband ?Work history: retired from Cabin crew job in [place] ? ? ?Comes in today for follow up of the following chronic medical issues: ? ?1. Primary hypertension ?No c/o chest pain, sob or headache. Does not check blood pressure at home. ? ?BP Readings from Last 3 Encounters:  ?12/04/20 134/85  ?05/30/20 115/65  ?11/30/19 132/69  ? ? ? ?2. Mixed hyperlipidemia ?Does try to watch diet and does no exercise at all. ?Lab Results  ?Component Value Date  ? CHOL 150 12/04/2020  ? HDL 70 12/04/2020  ? [place] 58 12/04/2020  ? TRIG 129 12/04/2020  ? CHOLHDL 2.1 12/04/2020  ?The 10-year ASCVD risk score (Arnett DK, et al., 2019) is: 7.9% ? ? ? ?3. Postoperative hypothyroidism ?No problems that she is aware of. ?Lab Results  ?Component Value Date  ? TSH 0.607 12/04/2020  ? ? ? ?4. Recurrent major depressive disorder, in full remission ([place]) ?Is on cymbalta and is doing well. ? ?  06/12/2021  ?  9:27 AM 12/14/2020  ?  9:07 AM 12/04/2020  ?  9:59 AM  ?Depression screen PHQ 2/9  ?Decreased Interest 0 0 0  ?Down, Depressed, Hopeless 0 0 0  ?PHQ - 2 Score 0 0 0  ?Altered sleeping 0    ?Tired, decreased energy 0    ?Change in appetite 0    ?Feeling bad or failure about yourself  0    ?Trouble concentrating 0    ?Moving slowly or fidgety/restless 0    ?Suicidal thoughts 0    ?PHQ-9 Score 0    ?Difficult doing work/chores Not difficult at all    ? ? ? ?5. GAD (generalized anxiety disorder) ?The cymbalta helps wiih stress. ? ?  06/12/2021  ?  9:28 AM 12/04/2020  ?  9:59 AM 05/30/2020  ? 10:05 AM 11/30/2019  ? 10:11 AM  ?GAD 7 : Generalized Anxiety Score  ?Nervous, Anxious, on Edge 0 0 0 0  ?Control/stop worrying 0 0 0 0  ?Worry too much - different things 0  0 0 0  ?Trouble relaxing 0 0 0 0  ?Restless 0 0 0 0  ?Easily annoyed or irritable 0 0 0 0  ?Afraid - awful might happen 0 0 0 0  ?Total GAD 7 Score 0 0 0 0  ?Anxiety Difficulty Not difficult at all Not difficult at all Not difficult at all Somewhat difficult  ? ? ? ? ?6. Osteopenia of lumbar spine ?Last dexascan was done  11/30/19  with tscore of -0.3 ? ?7. Morbid obesity ([place]) ?No recent weight changes ?Wt Readings from Last 3 Encounters:  ?06/12/21 203 lb (92.1 kg)  ?12/14/20 205 lb (93 kg)  ?12/04/20 204 lb 9.6 oz (92.8 kg)  ? ?BMI Readings from Last 3 Encounters:  ?06/12/21 34.84 kg/m?  ?12/14/20 35.19 kg/m?  ?12/04/20 35.12 kg/m?  ? ? ? ? ?New complaints: ?None today ? ?Allergies  ?Allergen Reactions  ? Penicillins Rash  ? ?Outpatient Encounter Medications as of 06/12/2021  ?Medication Sig  ? Ascorbic Acid (VITAMIN C) 100 MG tablet Take 100 mg by mouth daily.  ? Calcium Carbonate (CALTRATE 600 PO) Take by mouth.  ?  Cholecalciferol (VITAMIN D) 2000 units tablet Take 2,000 Units by mouth daily.   ? cyanocobalamin 1000 MCG tablet Take 1,000 mcg by mouth daily.  ? DULoxetine (CYMBALTA) 30 MG capsule Take 1 capsule (30 mg total) by mouth daily.  ? fish oil-omega-3 fatty acids 1000 MG capsule Take 2 g by mouth daily.  ? levothyroxine (SYNTHROID) 50 MCG tablet Take 1 tablet (50 mcg total) by mouth daily.  ? losartan (COZAAR) 100 MG tablet Take 1 tablet (100 mg total) by mouth daily.  ? simvastatin (ZOCOR) 40 MG tablet Take 1 tablet (40 mg total) by mouth daily at 6 PM.  ? ?Facility-Administered Encounter Medications as of 06/12/2021  ?Medication  ? 0.9 %  sodium chloride infusion  ? ? ?Past Surgical History:  ?Procedure Laterality Date  ? ABDOMINAL HYSTERECTOMY    ? COLONOSCOPY    ? DENTAL RESTORATION/EXTRACTION WITH X-RAY    ? DILATION AND CURETTAGE OF UTERUS    ? MOHS SURGERY    ? TUMOR EXCISION    ? thyroid 1975  ? tumor of thyroid gland    ? ? ?Family History  ?Problem Relation Age of Onset  ? Diabetes Mother   ?      pre diabetic  per patient  ? Heart disease Father   ? Diabetes Sister   ? Down syndrome Sister   ? Diabetes Brother   ? Diabetes Brother   ? Colon cancer Paternal Uncle   ?     60's  ? Colon cancer Paternal Grandmother   ?     35's  ? Breast cancer Cousin   ? Esophageal cancer Neg Hx   ? Rectal cancer Neg Hx   ? Stomach cancer Neg Hx   ? ? ? ? ?Controlled substance contract: n/a ? ? ? ? ?Review of Systems  ?Constitutional:  Negative for diaphoresis.  ?Eyes:  Negative for pain.  ?Respiratory:  Negative for shortness of breath.   ?Cardiovascular:  Negative for chest pain, palpitations and leg swelling.  ?Gastrointestinal:  Negative for abdominal pain.  ?Endocrine: Negative for polydipsia.  ?Skin:  Negative for rash.  ?Neurological:  Negative for dizziness, weakness and headaches.  ?Hematological:  Does not bruise/bleed easily.  ?All other systems reviewed and are negative. ? ?   ?Objective:  ? Physical Exam ?Vitals and nursing note reviewed.  ?Constitutional:   ?   General: She is not in acute distress. ?   Appearance: Normal appearance. She is well-developed.  ?HENT:  ?   Head: Normocephalic.  ?   Right Ear: Tympanic membrane normal.  ?   Left Ear: Tympanic membrane normal.  ?   Nose: Nose normal.  ?   Mouth/Throat:  ?   Mouth: Mucous membranes are moist.  ?Eyes:  ?   Pupils: Pupils are equal, round, and reactive to light.  ?Neck:  ?   Vascular: No carotid bruit or JVD.  ?Cardiovascular:  ?   Rate and Rhythm: Normal rate and regular rhythm.  ?   Heart sounds: Normal heart sounds.  ?Pulmonary:  ?   Effort: Pulmonary effort is normal. No respiratory distress.  ?   Breath sounds: Normal breath sounds. No wheezing or rales.  ?Chest:  ?   Chest wall: No tenderness.  ?Abdominal:  ?   General: Bowel sounds are normal. There is no distension or abdominal bruit.  ?   Palpations: Abdomen is soft. There is no hepatomegaly, splenomegaly, mass or pulsatile mass.  ?   Tenderness: There is  no abdominal tenderness.   ?Musculoskeletal:     ?   General: Normal range of motion.  ?   Cervical back: Normal range of motion and neck supple.  ?Lymphadenopathy:  ?   Cervical: No cervical adenopathy.  ?Skin: ?   General: Skin is warm and dry.  ?Neurological:  ?   Mental Status: She is alert and oriented to person, place, and time.  ?   Deep Tendon Reflexes: Reflexes are normal and symmetric.  ?Psychiatric:     ?   Behavior: Behavior normal.     ?   Thought Content: Thought content normal.     ?   Judgment: Judgment normal.  ? ?BP 115/67   Pulse 68   Temp (!) 97.4 ?F (36.3 ?C) (Temporal)   Resp 20   Ht 5' 4"  (1.626 m)   Wt 203 lb (92.1 kg)   LMP 06/01/1997   SpO2 98%   BMI 34.84 kg/m?  ? ? ? ? ?   ?Assessment & Plan:  ? ?Hafsa A Schone comes in today with chief complaint of Medical Management of Chronic Issues ? ? ?Diagnosis and orders addressed: ? ?1. Primary hypertension ?Low sodium diet ?- losartan (COZAAR) 100 MG tablet; Take 1 tablet (100 mg total) by mouth daily.  Dispense: 90 tablet; Refill: 1 ?- CBC with Differential/Platelet ?- CMP14+EGFR ? ?2. Mixed hyperlipidemia ?Low fat diet ?- simvastatin (ZOCOR) 40 MG tablet; Take 1 tablet (40 mg total) by mouth daily at 6 PM.  Dispense: 90 tablet; Refill: 1 ?- Lipid panel ? ?3. Postoperative hypothyroidism ?Labs pending ?- levothyroxine (SYNTHROID) 50 MCG tablet; Take 1 tablet (50 mcg total) by mouth daily.  Dispense: 90 tablet; Refill: 1 ?- Thyroid Panel With TSH ? ?4. Recurrent major depressive disorder, in full remission ([place]) ?Stress management ?- DULoxetine (CYMBALTA) 30 MG capsule; Take 1 capsule (30 mg total) by mouth daily.  Dispense: 90 capsule; Refill: 1 ? ?5. GAD (generalized anxiety disorder) ? ?6. Osteopenia of lumbar spine ?Weight bearing exercise ? ?7. Morbid obesity ([place]) ?Discussed diet and exercise for person with BMI >25 ?Will recheck weight in 3-6 months ? ? ? ?Labs pending ?Health Maintenance reviewed ?Diet and exercise encouraged ? ?Follow up plan: ?6  month ? ? ?[name] Done, FNP ? ?

## 2021-06-12 NOTE — Patient Instructions (Signed)

## 2021-06-13 LAB — LIPID PANEL
Chol/HDL Ratio: 2.4 ratio (ref 0.0–4.4)
Cholesterol, Total: 157 mg/dL (ref 100–199)
HDL: 66 mg/dL (ref >39)
LDL Chol Calc (NIH): 69 mg/dL (ref 0–99)
Triglycerides: 130 mg/dL (ref 0–149)
VLDL Cholesterol Cal: 22 mg/dL (ref 5–40)

## 2021-06-13 LAB — CMP14+EGFR
ALT: 12 IU/L (ref 0–32)
AST: 17 IU/L (ref 0–40)
Albumin/Globulin Ratio: 1.7 (ref 1.2–2.2)
Albumin: 4.2 g/dL (ref 3.8–4.8)
Alkaline Phosphatase: 79 IU/L (ref 44–121)
BUN/Creatinine Ratio: 13 (ref 12–28)
BUN: 13 mg/dL (ref 8–27)
Bilirubin Total: 0.9 mg/dL (ref 0.0–1.2)
CO2: 24 mmol/L (ref 20–29)
Calcium: 11 mg/dL — ABNORMAL HIGH (ref 8.7–10.3)
Chloride: 103 mmol/L (ref 96–106)
Creatinine, Ser: 0.99 mg/dL (ref 0.57–1.00)
Globulin, Total: 2.5 g/dL (ref 1.5–4.5)
Glucose: 139 mg/dL — ABNORMAL HIGH (ref 70–99)
Potassium: 5.3 mmol/L — ABNORMAL HIGH (ref 3.5–5.2)
Sodium: 140 mmol/L (ref 134–144)
Total Protein: 6.7 g/dL (ref 6.0–8.5)
eGFR: 62 mL/min/{1.73_m2} (ref >59)

## 2021-06-13 LAB — CBC WITH DIFFERENTIAL/PLATELET
Basophils Absolute: 0.1 10*3/uL (ref 0.0–0.2)
Basos: 1 %
EOS (ABSOLUTE): 0.2 10*3/uL (ref 0.0–0.4)
Eos: 3 %
Hematocrit: 45.8 % (ref 34.0–46.6)
Hemoglobin: 15.3 g/dL (ref 11.1–15.9)
Immature Grans (Abs): 0 10*3/uL (ref 0.0–0.1)
Immature Granulocytes: 0 %
Lymphocytes Absolute: 1.8 10*3/uL (ref 0.7–3.1)
Lymphs: 32 %
MCH: 30.1 pg (ref 26.6–33.0)
MCHC: 33.4 g/dL (ref 31.5–35.7)
MCV: 90 fL (ref 79–97)
Monocytes Absolute: 0.5 10*3/uL (ref 0.1–0.9)
Monocytes: 9 %
Neutrophils Absolute: 3 10*3/uL (ref 1.4–7.0)
Neutrophils: 55 %
Platelets: 280 10*3/uL (ref 150–450)
RBC: 5.09 x10E6/uL (ref 3.77–5.28)
RDW: 12.5 % (ref 11.7–15.4)
WBC: 5.6 10*3/uL (ref 3.4–10.8)

## 2021-06-13 LAB — THYROID PANEL WITH TSH
Free Thyroxine Index: 2.9 (ref 1.2–4.9)
T3 Uptake Ratio: 28 % (ref 24–39)
T4, Total: 10.5 ug/dL (ref 4.5–12.0)
TSH: 0.895 u[IU]/mL (ref 0.450–4.500)

## 2021-07-10 ENCOUNTER — Encounter: Payer: Self-pay | Admitting: Internal Medicine

## 2021-07-30 ENCOUNTER — Encounter: Payer: Self-pay | Admitting: Internal Medicine

## 2021-08-30 ENCOUNTER — Other Ambulatory Visit: Payer: Self-pay | Admitting: Nurse Practitioner

## 2021-08-30 DIAGNOSIS — E782 Mixed hyperlipidemia: Secondary | ICD-10-CM

## 2021-12-17 ENCOUNTER — Other Ambulatory Visit: Payer: Self-pay | Admitting: Nurse Practitioner

## 2021-12-17 DIAGNOSIS — F3342 Major depressive disorder, recurrent, in full remission: Secondary | ICD-10-CM

## 2021-12-24 ENCOUNTER — Encounter: Payer: Self-pay | Admitting: Nurse Practitioner

## 2021-12-24 ENCOUNTER — Ambulatory Visit: Payer: Medicare Other | Admitting: Nurse Practitioner

## 2021-12-24 ENCOUNTER — Other Ambulatory Visit: Payer: Self-pay | Admitting: Nurse Practitioner

## 2021-12-24 VITALS — BP 144/80 | HR 61 | Temp 97.2°F | Resp 20 | Ht 64.0 in | Wt 199.0 lb

## 2021-12-24 DIAGNOSIS — M8588 Other specified disorders of bone density and structure, other site: Secondary | ICD-10-CM | POA: Diagnosis not present

## 2021-12-24 DIAGNOSIS — F411 Generalized anxiety disorder: Secondary | ICD-10-CM

## 2021-12-24 DIAGNOSIS — Z23 Encounter for immunization: Secondary | ICD-10-CM | POA: Diagnosis not present

## 2021-12-24 DIAGNOSIS — I10 Essential (primary) hypertension: Secondary | ICD-10-CM | POA: Diagnosis not present

## 2021-12-24 DIAGNOSIS — E782 Mixed hyperlipidemia: Secondary | ICD-10-CM | POA: Diagnosis not present

## 2021-12-24 DIAGNOSIS — E890 Postprocedural hypothyroidism: Secondary | ICD-10-CM

## 2021-12-24 DIAGNOSIS — F3342 Major depressive disorder, recurrent, in full remission: Secondary | ICD-10-CM

## 2021-12-24 MED ORDER — LEVOTHYROXINE SODIUM 50 MCG PO TABS: 50.0000 ug | ORAL_TABLET | Freq: Every day | ORAL | 1 refills | Status: DC

## 2021-12-24 MED ORDER — SIMVASTATIN 40 MG PO TABS: ORAL_TABLET | ORAL | 1 refills | Status: DC

## 2021-12-24 MED ORDER — DULOXETINE HCL 30 MG PO CPEP: 30.0000 mg | ORAL_CAPSULE | Freq: Every day | ORAL | 1 refills | Status: DC

## 2021-12-24 MED ORDER — LOSARTAN POTASSIUM 100 MG PO TABS: 100.0000 mg | ORAL_TABLET | Freq: Every day | ORAL | 1 refills | Status: DC

## 2021-12-24 NOTE — Progress Notes (Signed)
Subjective:    Patient ID: Linda Hernandez, female    DOB: Jul 10, 1950, 71 y.o.   MRN: [id number]   Chief Complaint: Medical Management of Chronic Issues    HPI:  Linda Hernandez is a 71 y.o. who identifies as a female who was assigned female at birth.   Social history: Lives with: husband Work history: retired   Scientist, forensic in today for follow up of the following chronic medical issues:  1. Primary hypertension No c/o chest pain, sob or headaches. She does not check her blood pressure at home. BP Readings from Last 3 Encounters:  12/24/21 (!) 144/80  06/12/21 115/67  12/04/20 134/85     2. Mixed hyperlipidemia Does try to watch diet and does walk occasionally , but no dedicated exercise. Lab Results  Component Value Date   CHOL 157 06/12/2021   HDL 66 06/12/2021   LDLCALC 69 06/12/2021   TRIG 130 06/12/2021   CHOLHDL 2.4 06/12/2021    3. Postoperative hypothyroidism No problems that she is aware. Lab Results  Component Value Date   TSH 0.895 06/12/2021     4. Osteopenia of lumbar spine No daily weight bearing exercise. Refuses repeat dexascan.  5. GAD (generalized anxiety disorder)    12/24/2021   10:16 AM 06/12/2021    9:28 AM 12/04/2020    9:59 AM 05/30/2020   10:05 AM  GAD 7 : Generalized Anxiety Score  Nervous, Anxious, on Edge 0 0 0 0  Control/stop worrying 0 0 0 0  Worry too much - different things 0 0 0 0  Trouble relaxing 0 0 0 0  Restless 0 0 0 0  Easily annoyed or irritable 0 0 0 0  Afraid - awful might happen 0 0 0 0  Total GAD 7 Score 0 0 0 0  Anxiety Difficulty Not difficult at all Not difficult at all Not difficult at all Not difficult at all      6. Recurrent major depressive disorder, in full remission ([place]) Is on cymbalta daily and is doing well.    12/24/2021   10:16 AM 06/12/2021    9:27 AM 12/14/2020    9:07 AM  Depression screen PHQ 2/9  Decreased Interest 0 0 0  Down, Depressed, Hopeless 0 0 0  PHQ - 2 Score 0 0 0  Altered  sleeping 0 0   Tired, decreased energy 0 0   Change in appetite 0 0   Feeling bad or failure about yourself  0 0   Trouble concentrating 0 0   Moving slowly or fidgety/restless 0 0   Suicidal thoughts 0 0   PHQ-9 Score 0 0   Difficult doing work/chores Not difficult at all Not difficult at all      7. Morbid obesity ([place]) Weight is down 4 lbs Wt Readings from Last 3 Encounters:  12/24/21 199 lb (90.3 kg)  06/12/21 203 lb (92.1 kg)  12/14/20 205 lb (93 kg)   BMI Readings from Last 3 Encounters:  12/24/21 34.16 kg/m  06/12/21 34.84 kg/m  12/14/20 35.19 kg/m      New complaints: None today  Allergies  Allergen Reactions   Penicillins Rash   Outpatient Encounter Medications as of 12/24/2021  Medication Sig   Ascorbic Acid (VITAMIN C) 100 MG tablet Take 100 mg by mouth daily.   Calcium Carbonate (CALTRATE 600 PO) Take by mouth.   Cholecalciferol (VITAMIN D) 2000 units tablet Take 2,000 Units by mouth daily.    cyanocobalamin 1000  MCG tablet Take 1,000 mcg by mouth daily.   DULoxetine (CYMBALTA) 30 MG capsule TAKE 1 CAPSULE BY MOUTH EVERY DAY   fish oil-omega-3 fatty acids 1000 MG capsule Take 2 g by mouth daily.   levothyroxine (SYNTHROID) 50 MCG tablet Take 1 tablet (50 mcg total) by mouth daily.   losartan (COZAAR) 100 MG tablet Take 1 tablet (100 mg total) by mouth daily.   simvastatin (ZOCOR) 40 MG tablet TAKE 1 TABLET BY MOUTH DAILY AT 6 PM.   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  Eyes:  Negative for pain.  Respiratory:  Negative for shortness of breath.   Cardiovascular:  Negative for chest pain, palpitations and leg swelling.  Gastrointestinal:  Negative for abdominal pain.  Endocrine: Negative for polydipsia.  Skin:  Negative for rash.  Neurological:  Negative for dizziness, weakness and headaches.  Hematological:  Does not bruise/bleed easily.  All other systems reviewed and are negative.      Objective:   Physical Exam Vitals and nursing note reviewed.  Constitutional:      General: She is not in acute distress.    Appearance: Normal appearance. She is well-developed.  HENT:     Head: Normocephalic.     Right Ear: Tympanic membrane normal.     Left Ear: Tympanic membrane normal.     Nose: Nose normal.     Mouth/Throat:     Mouth: Mucous membranes are moist.  Eyes:     Pupils: Pupils are equal, round, and reactive to light.  Neck:     Vascular: No carotid bruit or JVD.  Cardiovascular:     Rate and Rhythm: Normal rate and regular rhythm.     Heart sounds: Normal heart sounds.  Pulmonary:     Effort: Pulmonary effort is normal. No respiratory distress.     Breath sounds: Normal breath sounds. No wheezing or rales.  Chest:     Chest wall: No tenderness.  Abdominal:     General: Bowel sounds are normal. There is no distension or abdominal bruit.     Palpations: Abdomen is soft. There is no hepatomegaly, splenomegaly, mass or pulsatile mass.     Tenderness: There is no abdominal tenderness.  Musculoskeletal:  General: Normal range of motion.     Cervical back: Normal range of motion and neck supple.  Lymphadenopathy:     Cervical: No cervical adenopathy.  Skin:    General: Skin is warm and dry.  Neurological:     Mental Status: She is alert and oriented to person, place, and time.     Deep Tendon Reflexes: Reflexes are normal and symmetric.  Psychiatric:        Behavior: Behavior normal.        Thought Content: Thought content normal.        Judgment: Judgment normal.    BP (!) 144/80   Pulse 61   Temp (!) 97.2 F (36.2 C) (Temporal)   Resp 20   Ht 5' 4"  (1.626 m)   Wt 199 lb (90.3 kg)   LMP 06/01/1997   SpO2 98%   BMI 34.16 kg/m         Assessment & Plan:   Shammara A [place]og[place] comes in today with chief complaint of Medical Management of Chronic Issues   Diagnosis and orders addressed:  1. Primary hypertension Low sodium diet - losartan (COZAAR) 100 MG tablet; Take 1 tablet (100 mg total) by mouth daily.  Dispense: 90 tablet; Refill: 1 - CBC with Differential/Platelet - CMP14+EGFR  2. Mixed hyperlipidemia Low fat diet - simvastatin (ZOCOR) 40 MG tablet; TAKE 1 TABLET BY MOUTH DAILY AT 6 PM.  Dispense: 90 tablet; Refill: 1 - Lipid panel  3. Postoperative hypothyroidism Labs pending - levothyroxine (SYNTHROID) 50 MCG tablet; Take 1 tablet (50 mcg total) by mouth daily.  Dispense: 90 tablet; Refill: 1 - Thyroid Panel With TSH  4. Osteopenia of lumbar spine Weight bearing exercises encouraged Refuses repeat dexascan  5. GAD (generalized anxiety disorder) Stress management  6. Recurrent major depressive disorder, in full remission ([place]) Stress management - DULoxetine (CYMBALTA) 30 MG capsule; Take 1 capsule (30 mg total) by mouth daily.  Dispense: 90 capsule; Refill: 1  7. Morbid obesity ([place]) Discussed diet and exercise for person with BMI >25 Will recheck weight in 3-6 months    Labs pending Health Maintenance reviewed Diet and exercise encouraged  Follow  up plan: 6 months   [name] Done, FNP

## 2021-12-24 NOTE — Patient Instructions (Signed)
Bone Health Bones protect organs, store calcium, anchor muscles, and support the whole body. Keeping your bones strong is important, especially as you get older. You can take actions to help keep your bones strong and healthy. Why is keeping my bones healthy important?  Keeping your bones healthy is important because your body constantly replaces bone cells. Cells get old, and new cells take their place. As we age, we lose bone cells because the body may not be able to make enough new cells to replace the old cells. The amount of bone cells and bone tissue you have is referred to as bone mass. The higher your bone mass, the stronger your bones. The aging process leads to an overall loss of bone mass in the body, which can increase the likelihood of: Broken bones. A condition in which the bones become weak and brittle (osteoporosis). A large decline in bone mass occurs in older adults. In women, it occurs about the time of menopause. What actions can I take to keep my bones healthy? Good health habits are important for maintaining healthy bones. This includes eating nutritious foods and exercising regularly. To have healthy bones, you need to get enough of the right minerals and vitamins. Most nutrition experts recommend getting these nutrients from the foods that you eat. In some cases, taking supplements may also be recommended. Doing certain types of exercise is also important for bone health. What are the nutritional recommendations for healthy bones?  Eating a well-balanced diet with plenty of calcium and vitamin D will help to protect your bones. Nutritional recommendations vary from person to person. Ask your health care provider what is healthy for you. Here are some general guidelines. Get enough calcium Calcium is the most important (essential) mineral for bone health. Most people can get enough calcium from their diet, but supplements may be recommended for people who are at risk for  osteoporosis. Good sources of calcium include: Dairy products, such as low-fat or nonfat milk, cheese, and yogurt. Dark green leafy vegetables, such as bok choy and broccoli. Foods that have calcium added to them (are fortified). Foods that may be fortified with calcium include orange juice, cereal, bread, soy beverages, and tofu products. Nuts, such as almonds. Follow these recommended amounts for daily calcium intake: Infants, 0-6 months: 200 mg. Infants, 6-12 months: 260 mg. Children, age 1-3: 700 mg. Children, age 4-8: 1,000 mg. Children, age 9-13: 1,300 mg. Teens, age 14-18: 1,300 mg. Adults, age 19-50: 1,000 mg. Adults, age 51-70: Men: 1,000 mg. Women: 1,200 mg. Adults, age 71 or older: 1,200 mg. Pregnant and breastfeeding females: Teens: 1,300 mg. Adults: 1,000 mg. Get enough vitamin D Vitamin D is the most essential vitamin for bone health. It helps the body absorb calcium. Sunlight stimulates the skin to make vitamin D, so be sure to get enough sunlight. If you live in a cold climate or you do not get outside often, your health care provider may recommend that you take vitamin D supplements. Good sources of vitamin D in your diet include: Egg yolks. Saltwater fish. Milk and cereal fortified with vitamin D. Follow these recommended amounts for daily vitamin D intake: Infants, 0-12 months: 400 international units (IU). Children and teens, age 1-18: 600 international units. Adults, age 59 or younger: 600 international units. Adults, age 60 or older: 600-1,000 international units. Get other important nutrients Other nutrients that are important for bone health include: Phosphorus. This mineral is found in meat, poultry, dairy foods, nuts, and legumes. The   recommended daily intake for adult men and adult women is 700 mg. Magnesium. This mineral is found in seeds, nuts, dark green vegetables, and legumes. The recommended daily intake for adult men is 400-420 mg. For adult women,  it is 310-320 mg. Vitamin K. This vitamin is found in green leafy vegetables. The recommended daily intake is 120 mcg for adult men and 90 mcg for adult women. What type of physical activity is best for building and maintaining healthy bones? Weight-bearing and strength-building activities are important for building and maintaining healthy bones. Weight-bearing activities cause muscles and bones to work against gravity. Strength-building activities increase the strength of the muscles that support bones. Weight-bearing and muscle-building activities include: Walking and hiking. Jogging and running. Dancing. Gym exercises. Lifting weights. Tennis and racquetball. Climbing stairs. Aerobics. Adults should get at least 30 minutes of moderate physical activity on most days. Children should get at least 60 minutes of moderate physical activity on most days. Ask your health care provider what type of exercise is best for you. How can I find out if my bone mass is low? Bone mass can be measured with an X-ray test called a bone mineral density (BMD) test. This test is recommended for all women who are age 65 or older. It may also be recommended for: Men who are age 70 or older. People who are at risk for osteoporosis because of: Having a long-term disease that weakens bones, such as kidney disease or rheumatoid arthritis. Having menopause earlier than normal. Taking medicine that weakens bones, such as steroids, thyroid hormones, or hormone treatment for breast cancer or prostate cancer. Smoking. Drinking three or more alcoholic drinks a day. Being underweight. Sedentary lifestyle. If you find that you have a low bone mass, you may be able to prevent osteoporosis or further bone loss by changing your diet and lifestyle. Where can I find more information? Bone Health & Osteoporosis Foundation: www.nof.org/patients National Institutes of Health: www.bones.nih.gov International Osteoporosis  Foundation: www.iofbonehealth.org Summary The aging process leads to an overall loss of bone mass in the body, which can increase the likelihood of broken bones and osteoporosis. Eating a well-balanced diet with plenty of calcium and vitamin D will help to protect your bones. Weight-bearing and strength-building activities are also important for building and maintaining strong bones. Bone mass can be measured with an X-ray test called a bone mineral density (BMD) test. This information is not intended to replace advice given to you by your health care provider. Make sure you discuss any questions you have with your health care provider. Document Revised: 08/09/2020 Document Reviewed: 08/09/2020 Elsevier Patient Education  2023 Elsevier Inc.  

## 2021-12-25 ENCOUNTER — Ambulatory Visit (INDEPENDENT_AMBULATORY_CARE_PROVIDER_SITE_OTHER): Payer: Medicare Other | Admitting: Family Medicine

## 2021-12-25 DIAGNOSIS — Z0000 Encounter for general adult medical examination without abnormal findings: Secondary | ICD-10-CM | POA: Diagnosis not present

## 2021-12-25 LAB — THYROID PANEL WITH TSH
Free Thyroxine Index: 2.5 (ref 1.2–4.9)
T3 Uptake Ratio: 26 % (ref 24–39)
T4, Total: 9.5 ug/dL (ref 4.5–12.0)
TSH: 0.995 u[IU]/mL (ref 0.450–4.500)

## 2021-12-25 LAB — CBC WITH DIFFERENTIAL/PLATELET
Basophils Absolute: 0.1 10*3/uL (ref 0.0–0.2)
Basos: 1 %
EOS (ABSOLUTE): 0.1 10*3/uL (ref 0.0–0.4)
Eos: 2 %
Hematocrit: 48 % — ABNORMAL HIGH (ref 34.0–46.6)
Hemoglobin: 16.1 g/dL — ABNORMAL HIGH (ref 11.1–15.9)
Immature Grans (Abs): 0 10*3/uL (ref 0.0–0.1)
Immature Granulocytes: 0 %
Lymphocytes Absolute: 1.6 10*3/uL (ref 0.7–3.1)
Lymphs: 29 %
MCH: 30.3 pg (ref 26.6–33.0)
MCHC: 33.5 g/dL (ref 31.5–35.7)
MCV: 90 fL (ref 79–97)
Monocytes Absolute: 0.5 10*3/uL (ref 0.1–0.9)
Monocytes: 9 %
Neutrophils Absolute: 3.2 10*3/uL (ref 1.4–7.0)
Neutrophils: 59 %
Platelets: 219 10*3/uL (ref 150–450)
RBC: 5.31 x10E6/uL — ABNORMAL HIGH (ref 3.77–5.28)
RDW: 12.6 % (ref 11.7–15.4)
WBC: 5.5 10*3/uL (ref 3.4–10.8)

## 2021-12-25 LAB — CMP14+EGFR
ALT: 20 IU/L (ref 0–32)
AST: 18 IU/L (ref 0–40)
Albumin/Globulin Ratio: 1.8 (ref 1.2–2.2)
Albumin: 4.3 g/dL (ref 3.9–4.9)
Alkaline Phosphatase: 89 IU/L (ref 44–121)
BUN/Creatinine Ratio: 18 (ref 12–28)
BUN: 17 mg/dL (ref 8–27)
Bilirubin Total: 0.9 mg/dL (ref 0.0–1.2)
CO2: 26 mmol/L (ref 20–29)
Calcium: 11 mg/dL — ABNORMAL HIGH (ref 8.7–10.3)
Chloride: 101 mmol/L (ref 96–106)
Creatinine, Ser: 0.92 mg/dL (ref 0.57–1.00)
Globulin, Total: 2.4 g/dL (ref 1.5–4.5)
Glucose: 121 mg/dL — ABNORMAL HIGH (ref 70–99)
Potassium: 5 mmol/L (ref 3.5–5.2)
Sodium: 140 mmol/L (ref 134–144)
Total Protein: 6.7 g/dL (ref 6.0–8.5)
eGFR: 67 mL/min/{1.73_m2} (ref >59)

## 2021-12-25 LAB — LIPID PANEL
Chol/HDL Ratio: 1.9 ratio (ref 0.0–4.4)
Cholesterol, Total: 141 mg/dL (ref 100–199)
HDL: 76 mg/dL (ref >39)
LDL Chol Calc (NIH): 46 mg/dL (ref 0–99)
Triglycerides: 106 mg/dL (ref 0–149)
VLDL Cholesterol Cal: 19 mg/dL (ref 5–40)

## 2021-12-25 NOTE — Patient Instructions (Signed)
  Linda Hernandez , Thank you for taking time to come for your Medicare Wellness Visit. I appreciate your ongoing commitment to your health goals. Please review the following plan we discussed and let me know if I can assist you in the future.   These are the goals we discussed:  Goals      Patient Stated     12/25/2021 AWV Goal: Exercise for General Health  Patient will verbalize understanding of the benefits of increased physical activity: Exercising regularly is important. It will improve your overall fitness, flexibility, and endurance. Regular exercise also will improve your overall health. It can help you control your weight, reduce stress, and improve your bone density. Over the next year, patient will increase physical activity as tolerated with a goal of at least 150 minutes of moderate physical activity per week.  You can tell that you are exercising at a moderate intensity if your heart starts beating faster and you start breathing faster but can still hold a conversation. Moderate-intensity exercise ideas include: Walking 1 mile (1.6 km) in about 15 minutes Biking Hiking Golfing Dancing Water aerobics Patient will verbalize understanding of everyday activities that increase physical activity by providing examples like the following: Yard work, such as: Sales promotion account executive Gardening Washing windows or floors Patient will be able to explain general safety guidelines for exercising:  Before you start a new exercise program, talk with your health care provider. Do not exercise so much that you hurt yourself, feel dizzy, or get very short of breath. Wear comfortable clothes and wear shoes with good support. Drink plenty of water while you exercise to prevent dehydration or heat stroke. Work out until your breathing and your heartbeat get faster.         This is a list of the screening recommended  for you and due dates:  Health Maintenance  Topic Date Due   DEXA scan (bone density measurement)  11/29/2021   COVID-19 Vaccine (5 - Pfizer series) 01/09/2022*   Mammogram  03/22/2023   Colon Cancer Screening  06/20/2023   Tetanus Vaccine  12/28/2023   Pneumonia Vaccine  Completed   Flu Shot  Completed   Hepatitis C Screening: USPSTF Recommendation to screen - Ages 18-79 yo.  Completed   Zoster (Shingles) Vaccine  Completed   HPV Vaccine  Aged Out  *Topic was postponed. The date shown is not the original due date.

## 2021-12-25 NOTE — Progress Notes (Signed)
Subjective:   Linda Hernandez is a 71 y.o. female who presents for Medicare Annual (Subsequent) preventive examination. I connected with  Linda Hernandez on 12/25/21 by a audio enabled telemedicine application and verified that I am speaking with the correct person using two identifiers.  Patient Location: Home  Provider Location: Office/Clinic  I discussed the limitations of evaluation and management by telemedicine. The patient expressed understanding and agreed to proceed.   Review of Systems    Defer to PCP  Cardiac Risk Factors include: advanced age (>73mn, >>2women);dyslipidemia;obesity (BMI >30kg/m2);hypertension;sedentary lifestyle     Objective:    There were no vitals filed for this visit. There is no height or weight on file to calculate BMI.     12/25/2021    9:23 AM 12/14/2020    9:47 AM 09/18/2018    2:08 PM 06/19/2016    8:41 AM 06/05/2016    9:59 AM  Advanced Directives  Does Patient Have a Medical Advance Directive? Yes Yes No No No  Type of [place] will [place] will     Does patient want to make changes to medical advance directive? No - Patient declined      Copy of [place]? No - copy requested No - copy requested     Would patient like information on creating a medical advance directive?   No - Patient declined      Current Medications (verified) Outpatient Encounter Medications as of 12/25/2021  Medication Sig   Ascorbic Acid (VITAMIN C) 100 MG tablet Take 100 mg by mouth daily.   Calcium Carbonate (CALTRATE 600 PO) Take by mouth.   Cholecalciferol (VITAMIN D) 2000 units tablet Take 2,000 Units by mouth daily.    cyanocobalamin 1000 MCG tablet Take 1,000 mcg by mouth daily.   DULoxetine (CYMBALTA) 30 MG capsule Take 1 capsule (30 mg total) by mouth daily.   fish oil-omega-3 fatty acids 1000 MG capsule Take 2 g by mouth daily.   levothyroxine (SYNTHROID)  50 MCG tablet Take 1 tablet (50 mcg total) by mouth daily.   losartan (COZAAR) 100 MG tablet Take 1 tablet (100 mg total) by mouth daily.   simvastatin (ZOCOR) 40 MG tablet TAKE 1 TABLET BY MOUTH DAILY AT 6 PM.   Facility-Administered Encounter Medications as of 12/25/2021  Medication   0.9 %  sodium chloride infusion    Allergies (verified) Penicillins   History: Past Medical History:  Diagnosis Date   Anxiety    Hx of adenomatous colonic polyps 06/19/2016   Hyperlipidemia    Hypertension    Snoring    Thyroid disease    thyroid tumor   Past Surgical History:  Procedure Laterality Date   ABDOMINAL HYSTERECTOMY     CATARACT EXTRACTION Left    COLONOSCOPY     DENTAL RESTORATION/EXTRACTION WITH X-RAY     DILATION AND CURETTAGE OF UTERUS     MOHS SURGERY     TUMOR EXCISION     thyroid 1975   tumor of thyroid gland     Family History  Problem Relation Age of Onset   Diabetes Mother        pre diabetic  per patient   Diabetes Father    Heart disease Father    Diabetes Sister    Down syndrome Sister    Hypertension Brother    Diabetes Brother    Diabetes Brother    Colon cancer Paternal  Uncle        74's   Colon cancer Paternal Grandmother        60's   Breast cancer Cousin    Esophageal cancer Neg Hx    Rectal cancer Neg Hx    Stomach cancer Neg Hx    Social History   Socioeconomic History   Marital status: Married    Spouse name: Linda Hernandez   Number of children: 3   Years of education: 12   Highest education level: High school graduate  Occupational History   Occupation: retired  Tobacco Use   Smoking status: Never   Smokeless tobacco: Never  Vaping Use   Vaping Use: Never used  Substance and Sexual Activity   Alcohol use: Yes    Comment: extremely rare 2 times yearly   Drug use: No   Sexual activity: Yes    Partners: Male    Birth control/protection: Surgical  Other Topics Concern   Not on file  Social History Narrative   Lives in one level home  with her husband - he is a Environmental education officer.   Son lives 13 miles away.   Social Determinants of Health   Financial Resource Strain: Low Risk  (12/25/2021)   Overall Financial Resource Strain (CARDIA)    Difficulty of Paying Living Expenses: Not hard at all  Food Insecurity: No Food Insecurity (12/25/2021)   Hunger Vital Sign    Worried About Running Out of Food in the Last Year: Never true    Ran Out of Food in the Last Year: Never true  Transportation Needs: No Transportation Needs (12/25/2021)   PRAPARE - Hydrologist (Medical): No    Lack of Transportation (Non-Medical): No  Physical Activity: Insufficiently Active (12/25/2021)   Exercise Vital Sign    Days of Exercise per Week: 2 days    Minutes of Exercise per Session: 20 min  Stress: No Stress Concern Present (12/25/2021)   [place]    Feeling of Stress : Not at all  Social Connections: [place] (12/25/2021)   Social Connection and Isolation Panel [NHANES]    Frequency of Communication with Friends and Family: Twice a week    Frequency of Social Gatherings with Friends and Family: Once a week    Attends Religious Services: More than 4 times per year    Active Member of [employer] or Organizations: Yes    Attends Music therapist: More than 4 times per year    Marital Status: Married    Tobacco Counseling Counseling given: Not Answered   Clinical Intake:  Pre-visit preparation completed: Yes  Pain : No/denies pain     BMI - recorded: 34.16 Nutritional Status: BMI > 30  Obese Nutritional Risks: None Diabetes: No  How often do you need to have someone help you when you read instructions, pamphlets, or other written materials from your doctor or pharmacy?: 1 - Never What is the last grade level you completed in school?: high school  Diabetic? No  Interpreter Needed?: No  Information entered by ::  Linda Hernandez   Activities of Daily Living    12/25/2021    9:24 AM 12/21/2021    8:31 PM  In your present state of health, do you have any difficulty performing the following activities:  Hearing? 0 0  Vision? 0 0  Difficulty concentrating or making decisions? 0 0  Walking or climbing stairs? 0 0  Dressing or  bathing? 0 0  Doing errands, shopping? 0 0  Preparing Food and eating ? N N  Using the Toilet? N N  In the past six months, have you accidently leaked urine? N N  Do you have problems with loss of bowel control? N N  Managing your Medications? N N  Managing your Finances? N N  Housekeeping or managing your Housekeeping? N N    Patient Care Team: Linda Pretty, Linda Hernandez as PCP - General (Nurse Practitioner)  Indicate any recent Medical Services you may have received from other than [facility] providers in the past year (date may be approximate).     Assessment:   This is a routine wellness examination for [place].  Hearing/Vision screen No results found.  Dietary issues and exercise activities discussed: Current Exercise Habits: Home exercise routine, Type of exercise: walking, Time (Minutes): 20, Frequency (Times/Week): 2, Weekly Exercise (Minutes/Week): 40, Intensity: Mild, Exercise limited by: None identified   Goals Addressed             This Visit's Progress    Patient Stated       12/25/2021 AWV Goal: Exercise for General Health  Patient will verbalize understanding of the benefits of increased physical activity: Exercising regularly is important. It will improve your overall fitness, flexibility, and endurance. Regular exercise also will improve your overall health. It can help you control your weight, reduce stress, and improve your bone density. Over the next year, patient will increase physical activity as tolerated with a goal of at least 150 minutes of moderate physical activity per week.  You can tell that you are exercising at a moderate intensity  if your heart starts beating faster and you start breathing faster but can still hold a conversation. Moderate-intensity exercise ideas include: Walking 1 mile (1.6 km) in about 15 minutes Biking Hiking Golfing Dancing Water aerobics Patient will verbalize understanding of everyday activities that increase physical activity by providing examples like the following: Yard work, such as: Sales promotion account executive Gardening Washing windows or floors Patient will be able to explain general safety guidelines for exercising:  Before you start a new exercise program, talk with your health care provider. Do not exercise so much that you hurt yourself, feel dizzy, or get very short of breath. Wear comfortable clothes and wear shoes with good support. Drink plenty of water while you exercise to prevent dehydration or heat stroke. Work out until your breathing and your heartbeat get faster.       Depression Screen    12/25/2021    9:17 AM 12/24/2021   10:16 AM 06/12/2021    9:27 AM 12/14/2020    9:07 AM 12/04/2020    9:59 AM 05/30/2020   10:05 AM 11/30/2019    9:58 AM  PHQ 2/9 Scores  PHQ - 2 Score 0 0 0 0 0 0 0  PHQ- 9 Score 1 0 0   0 0    Fall Risk    12/25/2021    9:24 AM 12/24/2021   10:16 AM 12/21/2021    8:31 PM 06/12/2021    9:27 AM 12/14/2020    9:47 AM  Fall Risk   Falls in the past year? 0 0 0 0 0  Number falls in past yr:   0  0  Injury with Fall?   0  0  Risk for fall due to :     No Fall Risks  Follow up     Falls prevention discussed    FALL RISK PREVENTION PERTAINING TO THE HOME:  Any stairs in or around the home? No  If so, are there any without handrails? NA Home free of loose throw rugs in walkways, pet beds, electrical cords, etc? Yes  Adequate lighting in your home to reduce risk of falls? Yes   ASSISTIVE DEVICES UTILIZED TO PREVENT FALLS:  Life alert? No  Use of a cane, walker or  w/c? No  Grab bars in the bathroom? No  Shower chair or bench in shower? No  Elevated toilet seat or a handicapped toilet? No     Cognitive Function:    09/15/2017   10:12 AM  MMSE - Mini Mental State Exam  Orientation to time 5  Orientation to Place 5  Registration 3  Attention/ Calculation 5  Recall 3  Language- name 2 objects 2  Language- repeat 1  Language- follow 3 step command 3  Language- read & follow direction 1  Write a sentence 1  Copy design 1  Total score 30        12/25/2021    9:27 AM 09/18/2018    2:10 PM  6CIT Screen  What Year? 0 points 0 points  What month? 0 points 0 points  What time? 0 points 0 points  Count back from 20 0 points 2 points  Months in reverse 2 points 0 points  Repeat phrase 0 points 2 points  Total Score 2 points 4 points    Immunizations Immunization History  Administered Date(s) Administered   Fluad Quad(high Dose 65+) 11/17/2018, 11/30/2019, 12/04/2020, 12/24/2021   Influenza, High Dose Seasonal PF 01/12/2018   Influenza,inj,Quad PF,6+ Mos 12/27/2013, 01/13/2015, 12/23/2016   Influenza-Unspecified 12/13/2016   PFIZER(Purple Top)SARS-COV-2 Vaccination 04/25/2019, 05/18/2019, 12/29/2019, 06/28/2020   Pneumococcal Conjugate-13 04/29/2016, 11/17/2018   Pneumococcal Polysaccharide-23 08/22/2017   Tdap 12/27/2013   Zoster Recombinat (Shingrix) 09/15/2017, 02/24/2018    TDAP status: Up to date  Flu Vaccine status: Up to date  Pneumococcal vaccine status: Up to date  Covid-19 vaccine status: Completed vaccines  Qualifies for Shingles Vaccine? Yes   Zostavax completed No   Shingrix Completed?: Yes  Screening Tests Health Maintenance  Topic Date Due   DEXA SCAN  11/29/2021   COVID-19 Vaccine (5 - Pfizer series) 01/09/2022 (Originally 08/23/2020)   MAMMOGRAM  03/22/2023   COLONOSCOPY (Pts 45-39yr Insurance coverage will need to be confirmed)  06/20/2023   TETANUS/TDAP  12/28/2023   Pneumonia Vaccine 69 Years old   Completed   INFLUENZA VACCINE  Completed   Hepatitis C Screening  Completed   Zoster Vaccines- Shingrix  Completed   HPV VACCINES  Aged Out    Health Maintenance  Health Maintenance Due  Topic Date Due   DEXA SCAN  11/29/2021    Colorectal cancer screening: Type of screening: Colonoscopy. Completed 06/19/2016. Repeat every 7 years  Mammogram status: Completed 03/21/2021. Repeat every year  Bone Density: Patient declined at this time.last dexa 11/30/2019  Lung Cancer Screening: (Low Dose CT Chest recommended if Age 69-80years, 30 pack-year currently smoking OR have quit w/in 15years.) does not qualify.   Lung Cancer Screening Referral: NA  Additional Screening:  Hepatitis C Screening: does not qualify; Completed 01/12/2015  Vision Screening: Recommended annual ophthalmology exams for early detection of glaucoma and other disorders of the eye. Is the patient up to date with their annual eye exam?  Yes  Who is the provider or what is the  name of the office in which the patient attends annual eye exams?  If pt is not established with a provider, would they like to be referred to a provider to establish care? No .   Dental Screening: Recommended annual dental exams for proper oral hygiene  Community Resource Referral / Chronic Care Management: CRR required this visit?  No   CCM required this visit?  No      Plan:     I have personally reviewed and noted the following in the patient's chart:   Medical and social history Use of alcohol, tobacco or illicit drugs  Current medications and supplements including opioid prescriptions. Patient is not currently taking opioid prescriptions. Functional ability and status Nutritional status Physical activity Advanced directives List of other physicians Hospitalizations, surgeries, and ER visits in previous 12 months Vitals Screenings to include cognitive, depression, and falls Referrals and appointments  In addition, I have  reviewed and discussed with patient certain preventive protocols, quality metrics, and best practice recommendations. A written personalized care plan for preventive services as well as general preventive health recommendations were provided to patient.     Ladean Raya, Rodriguez Hevia   12/25/2021   Nurse Notes: AVS printed and mailed

## 2022-04-23 ENCOUNTER — Other Ambulatory Visit: Payer: Self-pay | Admitting: Nurse Practitioner

## 2022-04-23 DIAGNOSIS — Z1231 Encounter for screening mammogram for malignant neoplasm of breast: Secondary | ICD-10-CM

## 2022-05-01 ENCOUNTER — Ambulatory Visit
Admission: RE | Admit: 2022-05-01 | Discharge: 2022-05-01 | Disposition: A | Discharge status: Home / Self Care | Payer: Medicare Other | Source: Ambulatory Visit | Attending: Nurse Practitioner | Admitting: Nurse Practitioner

## 2022-05-01 DIAGNOSIS — Z1231 Encounter for screening mammogram for malignant neoplasm of breast: Secondary | ICD-10-CM

## 2022-06-25 ENCOUNTER — Other Ambulatory Visit (INDEPENDENT_AMBULATORY_CARE_PROVIDER_SITE_OTHER): Payer: Medicare Other

## 2022-06-25 ENCOUNTER — Ambulatory Visit: Payer: Medicare Other | Admitting: Nurse Practitioner

## 2022-06-25 ENCOUNTER — Encounter: Payer: Self-pay | Admitting: Nurse Practitioner

## 2022-06-25 ENCOUNTER — Other Ambulatory Visit: Payer: Self-pay | Admitting: Nurse Practitioner

## 2022-06-25 VITALS — BP 116/73 | HR 65 | Temp 97.3°F | Resp 20 | Ht 64.0 in | Wt 198.0 lb

## 2022-06-25 DIAGNOSIS — Z780 Asymptomatic menopausal state: Secondary | ICD-10-CM

## 2022-06-25 DIAGNOSIS — E890 Postprocedural hypothyroidism: Secondary | ICD-10-CM

## 2022-06-25 DIAGNOSIS — I10 Essential (primary) hypertension: Secondary | ICD-10-CM | POA: Diagnosis not present

## 2022-06-25 DIAGNOSIS — F3342 Major depressive disorder, recurrent, in full remission: Secondary | ICD-10-CM

## 2022-06-25 DIAGNOSIS — E782 Mixed hyperlipidemia: Secondary | ICD-10-CM | POA: Diagnosis not present

## 2022-06-25 DIAGNOSIS — M8588 Other specified disorders of bone density and structure, other site: Secondary | ICD-10-CM

## 2022-06-25 DIAGNOSIS — F411 Generalized anxiety disorder: Secondary | ICD-10-CM

## 2022-06-25 LAB — THYROID PANEL WITH TSH

## 2022-06-25 MED ORDER — DULOXETINE HCL 30 MG PO CPEP: 30.0000 mg | ORAL_CAPSULE | Freq: Every day | ORAL | 1 refills | Status: DC

## 2022-06-25 MED ORDER — LOSARTAN POTASSIUM 100 MG PO TABS: 100.0000 mg | ORAL_TABLET | Freq: Every day | ORAL | 1 refills | Status: DC

## 2022-06-25 MED ORDER — LEVOTHYROXINE SODIUM 50 MCG PO TABS: 50.0000 ug | ORAL_TABLET | Freq: Every day | ORAL | 1 refills | Status: DC

## 2022-06-25 MED ORDER — SIMVASTATIN 40 MG PO TABS: ORAL_TABLET | ORAL | 1 refills | Status: DC

## 2022-06-25 NOTE — Progress Notes (Signed)
Subjective:    Patient ID: Linda Hernandez, female    DOB: 10-15-1950, 72 y.o.   MRN: [id number]   Chief Complaint: Medical Management of Chronic Issues    HPI:  SHALAMAR Hernandez is a 72 y.o. who identifies as a female who was assigned female at birth.   Social history: Lives with: husband  Work history: retired   Water engineer in today for follow up of the following chronic medical issues:  1. Primary hypertension No c/o chest pain, sob or headache. Does not check blood pressure at home. BP Readings from Last 3 Encounters:  06/25/22 116/73  12/24/21 (!) 144/80  06/12/21 115/67     2. Mixed hyperlipidemia Doe snot watch diet and does no dedicated exercise Lab Results  Component Value Date   CHOL 141 12/24/2021   HDL 76 12/24/2021   LDLCALC 46 12/24/2021   TRIG 106 12/24/2021   CHOLHDL 1.9 12/24/2021   The 10-year ASCVD risk score (Arnett DK, et al., 2019) is: 8.9%   3. Postoperative hypothyroidism No issues that she is aware of Lab Results  Component Value Date   TSH 0.995 12/24/2021     4. Recurrent major depressive disorder, in full remission 5. GAD (generalized anxiety disorder) Is on cymbalta and is dong well.    06/25/2022    9:28 AM 12/25/2021    9:17 AM 12/24/2021   10:16 AM  Depression screen PHQ 2/9  Decreased Interest 0 0 0  Down, Depressed, Hopeless 0 0 0  PHQ - 2 Score 0 0 0  Altered sleeping 0 0 0  Tired, decreased energy 0 1 0  Change in appetite 0 0 0  Feeling bad or failure about yourself  0 0 0  Trouble concentrating 0 0 0  Moving slowly or fidgety/restless 0 0 0  Suicidal thoughts 0 0 0  PHQ-9 Score 0 1 0  Difficult doing work/chores Not difficult at all Not difficult at all Not difficult at all      06/25/2022    9:28 AM 12/24/2021   10:16 AM 06/12/2021    9:28 AM 12/04/2020    9:59 AM  GAD 7 : Generalized Anxiety Score  Nervous, Anxious, on Edge 0 0 0 0  Control/stop worrying 0 0 0 0  Worry too much - different things 0 0 0 0  Trouble  relaxing 0 0 0 0  Restless 0 0 0 0  Easily annoyed or irritable 0 0 0 0  Afraid - awful might happen 0 0 0 0  Total GAD 7 Score 0 0 0 0  Anxiety Difficulty Not difficult at all Not difficult at all Not difficult at all Not difficult at all      6. Osteopenia of lumbar spine Is having bone density test today  7. Morbid obesity (HCC) No recent weight changes Wt Readings from Last 3 Encounters:  06/25/22 198 lb (89.8 kg)  12/24/21 199 lb (90.3 kg)  06/12/21 203 lb (92.1 kg)  ' BMI Readings from Last 3 Encounters:  06/25/22 33.99 kg/m  12/24/21 34.16 kg/m  06/12/21 34.84 kg/m      New complaints: None today  Allergies  Allergen Reactions   Penicillins Rash   Outpatient Encounter Medications as of 06/25/2022  Medication Sig   Ascorbic Acid (VITAMIN C) 100 MG tablet Take 100 mg by mouth daily.   Calcium Carbonate (CALTRATE 600 PO) Take by mouth.   Cholecalciferol (VITAMIN D) 2000 units tablet Take 2,000 Units by mouth daily.  cyanocobalamin 1000 MCG tablet Take 1,000 mcg by mouth daily.   DULoxetine (CYMBALTA) 30 MG capsule Take 1 capsule (30 mg total) by mouth daily.   fish oil-omega-3 fatty acids 1000 MG capsule Take 2 g by mouth daily.   levothyroxine (SYNTHROID) 50 MCG tablet Take 1 tablet (50 mcg total) by mouth daily.   losartan (COZAAR) 100 MG tablet Take 1 tablet (100 mg total) by mouth daily.   simvastatin (ZOCOR) 40 MG tablet TAKE 1 TABLET BY MOUTH DAILY AT 6 PM.   Facility-Administered Encounter Medications as of 06/25/2022  Medication   0.9 %  sodium chloride infusion    Past Surgical History:  Procedure Laterality Date   ABDOMINAL HYSTERECTOMY     CATARACT EXTRACTION Left    COLONOSCOPY     DENTAL RESTORATION/EXTRACTION WITH X-RAY     DILATION AND CURETTAGE OF UTERUS     MOHS SURGERY     TUMOR EXCISION     thyroid 1975   tumor of thyroid gland      Family History  Problem Relation Age of Onset   Diabetes Mother        pre diabetic  per  patient   Diabetes Father    Heart disease Father    Diabetes Sister    Down syndrome Sister    Hypertension Brother    Diabetes Brother    Diabetes Brother    Colon cancer Paternal Uncle        60's   Colon cancer Paternal Grandmother        7's   Breast cancer Cousin    Esophageal cancer Neg Hx    Rectal cancer Neg Hx    Stomach cancer Neg Hx       Controlled substance contract: n/a     Review of Systems  Constitutional:  Negative for diaphoresis.  Eyes:  Negative for pain.  Respiratory:  Negative for shortness of breath.   Cardiovascular:  Negative for chest pain, palpitations and leg swelling.  Gastrointestinal:  Negative for abdominal pain.  Endocrine: Negative for polydipsia.  Skin:  Negative for rash.  Neurological:  Negative for dizziness, weakness and headaches.  Hematological:  Does not bruise/bleed easily.  All other systems reviewed and are negative.      Objective:   Physical Exam Vitals and nursing note reviewed.  Constitutional:      General: She is not in acute distress.    Appearance: Normal appearance. She is well-developed.  HENT:     Head: Normocephalic.     Right Ear: Tympanic membrane normal.     Left Ear: Tympanic membrane normal.     Nose: Nose normal.     Mouth/Throat:     Mouth: Mucous membranes are moist.  Eyes:     Pupils: Pupils are equal, round, and reactive to light.  Neck:     Vascular: No carotid bruit or JVD.  Cardiovascular:     Rate and Rhythm: Normal rate and regular rhythm.     Heart sounds: Normal heart sounds.  Pulmonary:     Effort: Pulmonary effort is normal. No respiratory distress.     Breath sounds: Normal breath sounds. No wheezing or rales.  Chest:     Chest wall: No tenderness.  Abdominal:     General: Bowel sounds are normal. There is no distension or abdominal bruit.     Palpations: Abdomen is soft. There is no hepatomegaly, splenomegaly, mass or pulsatile mass.     Tenderness: There is no  abdominal  tenderness.  Musculoskeletal:        General: Normal range of motion.     Cervical back: Normal range of motion and neck supple.  Lymphadenopathy:     Cervical: No cervical adenopathy.  Skin:    General: Skin is warm and dry.  Neurological:     Mental Status: She is alert and oriented to person, place, and time.     Deep Tendon Reflexes: Reflexes are normal and symmetric.  Psychiatric:        Behavior: Behavior normal.        Thought Content: Thought content normal.        Judgment: Judgment normal.     BP 116/73   Pulse 65   Temp (!) 97.3 F (36.3 C) (Temporal)   Resp 20   Ht 5\' 4"  (1.626 m)   Wt 198 lb (89.8 kg)   LMP 06/01/1997   SpO2 96%   BMI 33.99 kg/m        Assessment & Plan:   Amariona A Caperton comes in today with chief complaint of Medical Management of Chronic Issues   Diagnosis and orders addressed:  1. Primary hypertension Low sodium diet - losartan (COZAAR) 100 MG tablet; Take 1 tablet (100 mg total) by mouth daily.  Dispense: 90 tablet; Refill: 1 - CBC with Differential/Platelet - CMP14+EGFR  2. Mixed hyperlipidemia Low fat diet - simvastatin (ZOCOR) 40 MG tablet; TAKE 1 TABLET BY MOUTH DAILY AT 6 PM.  Dispense: 90 tablet; Refill: 1 - Lipid panel  3. Postoperative hypothyroidism labs pending - levothyroxine (SYNTHROID) 50 MCG tablet; Take 1 tablet (50 mcg total) by mouth daily.  Dispense: 90 tablet; Refill: 1 - Thyroid Panel With TSH  4. Recurrent major depressive disorder, in full remission Stress management - DULoxetine (CYMBALTA) 30 MG capsule; Take 1 capsule (30 mg total) by mouth daily.  Dispense: 90 capsule; Refill: 1  5. GAD (generalized anxiety disorder)  6. Osteopenia of lumbar spine Will discuss results after available  7. Morbid obesity (HCC) Discussed diet and exercise for person with BMI >25 Will recheck weight in 3-6 months    Labs pending Health Maintenance reviewed Diet and exercise encouraged  Follow up plan: 6  months   Mary-Margaret Daphine Deutscher, FNP

## 2022-06-25 NOTE — Patient Instructions (Signed)
Cooking With Less Salt Cooking with less salt is one way to reduce the amount of sodium you get from food. Sodium is one of the elements that make up salt. It is found naturally in foods and is also added to certain foods. Depending on your condition and overall health, your health care provider or dietitian may recommend that you reduce your sodium intake. Most people should have less than 2,300 milligrams (mg) of sodium each day. If you have high blood pressure (hypertension), you may need to limit your sodium to 1,500 mg each day. Follow the tips below to help reduce your sodium intake. What are tips for eating less sodium? Reading food labels  Check the food label before buying or using packaged ingredients. Always check the label for the serving size and sodium content. Look for products with no more than 140 mg of sodium in one serving. Check the % Daily Value column to see what percent of the daily recommended amount of sodium is provided in one serving of the product. Foods with 5% or less in this column are considered low in sodium. Foods with 20% or higher are considered high in sodium. Do not choose foods with salt as one of the first three ingredients on the ingredients list. If salt is one of the first three ingredients, it usually means the item is high in sodium. Shopping Buy sodium-free or low-sodium products. Look for the following words on food labels: Low-sodium. Sodium-free. Reduced-sodium. No salt added. Unsalted. Always check the sodium content even if foods are labeled as low-sodium or no salt added. Buy fresh foods. Cooking Use herbs, seasonings without salt, and spices as substitutes for salt. Use sodium-free baking soda when baking. Grill, braise, or roast foods to add flavor with less salt. Avoid adding salt to pasta, rice, or hot cereals. Drain and rinse canned vegetables, beans, and meat before use. Avoid adding salt when cooking sweets and desserts. Cook with  low-sodium ingredients. What foods are high in sodium? Vegetables Regular canned vegetables (not low-sodium or reduced-sodium). Sauerkraut, pickled vegetables, and relishes. Olives. French fries. Onion rings. Regular canned tomato sauce and paste. Regular tomato and vegetable juice. Frozen vegetables in sauces. Grains Instant hot cereals. Bread stuffing, pancake, and biscuit mixes. Croutons. Seasoned rice or pasta mixes. Noodle soup cups. Boxed or frozen macaroni and cheese. Regular salted crackers. Self-rising flour. Rolls. Bagels. Flour tortillas and wraps. Meats and other proteins Meat or fish that is salted, canned, smoked, cured, spiced, or pickled. This includes bacon, ham, sausages, hot dogs, corned beef, chipped beef, meat loaves, salt pork, jerky, pickled herring, anchovies, regular canned tuna, and sardines. Salted nuts. Dairy Processed cheese and cheese spreads. Cheese curds. Blue cheese. Feta cheese. String cheese. Regular cottage cheese. Buttermilk. Canned milk. The items listed above may not be a complete list of foods high in sodium. Actual amounts of sodium may be different depending on processing. Contact a dietitian for more information. What foods are low in sodium? Fruits Fresh, frozen, or canned fruit with no sauce added. Fruit juice. Vegetables Fresh or frozen vegetables with no sauce added. "No salt added" canned vegetables. "No salt added" tomato sauce and paste. Low-sodium or reduced-sodium tomato and vegetable juice. Grains Noodles, pasta, quinoa, rice. Shredded or puffed wheat or puffed rice. Regular or quick oats (not instant). Low-sodium crackers. Low-sodium bread. Whole-grain bread and whole-grain pasta. Unsalted popcorn. Meats and other proteins Fresh or frozen whole meats, poultry (not injected with sodium), and fish with no sauce added.   Unsalted nuts. Dried peas, beans, and lentils without added salt. Unsalted canned beans. Eggs. Unsalted nut butters. Low-sodium  canned tuna or chicken. Dairy Milk. Soy milk. Yogurt. Low-sodium cheeses, such as Swiss, Monterey Jack, mozzarella, and ricotta. Sherbet or ice cream (keep to  cup per serving). Cream cheese. Fats and oils Unsalted butter or margarine. Other foods Homemade pudding. Sodium-free baking soda and baking powder. Herbs and spices. Low-sodium seasoning mixes. Beverages Coffee and tea. Carbonated beverages. The items listed above may not be a complete list of foods low in sodium. Actual amounts of sodium may be different depending on processing. Contact a dietitian for more information. What are some salt alternatives when cooking? The following are herbs, seasonings, and spices that can be used instead of salt to flavor your food. Herbs should be fresh or dried. Do not choose packaged mixes. Next to the name of the herb, spice, or seasoning are some examples of foods you can pair it with. Herbs Bay leaves - Soups, meat and vegetable dishes, and spaghetti sauce. Basil - Italian dishes, soups, pasta, and fish dishes. Cilantro - Meat, poultry, and vegetable dishes. Chili powder - Marinades and Mexican dishes. Chives - Salad dressings and potato dishes. Cumin - Mexican dishes, couscous, and meat dishes. Dill - Fish dishes, sauces, and salads. Fennel - Meat and vegetable dishes, breads, and cookies. Garlic (do not use garlic salt) - Italian dishes, meat dishes, salad dressings, and sauces. Marjoram - Soups, potato dishes, and meat dishes. Oregano - Pizza and spaghetti sauce. Parsley - Salads, soups, pasta, and meat dishes. Rosemary - Italian dishes, salad dressings, soups, and red meats. 

## 2022-06-26 LAB — CMP14+EGFR
ALT: 14 IU/L (ref 0–32)
AST: 15 IU/L (ref 0–40)
Albumin/Globulin Ratio: 1.9 (ref 1.2–2.2)
Albumin: 4.2 g/dL (ref 3.9–4.9)
Alkaline Phosphatase: 86 IU/L (ref 44–121)
BUN/Creatinine Ratio: 14 (ref 12–28)
BUN: 14 mg/dL (ref 8–27)
Bilirubin Total: 0.9 mg/dL (ref 0.0–1.2)
CO2: 24 mmol/L (ref 20–29)
Calcium: 10.9 mg/dL — ABNORMAL HIGH (ref 8.7–10.3)
Chloride: 103 mmol/L (ref 96–106)
Creatinine, Ser: 1 mg/dL (ref 0.57–1.00)
Globulin, Total: 2.2 g/dL (ref 1.5–4.5)
Glucose: 133 mg/dL — ABNORMAL HIGH (ref 70–99)
Potassium: 4.8 mmol/L (ref 3.5–5.2)
Sodium: 139 mmol/L (ref 134–144)
Total Protein: 6.4 g/dL (ref 6.0–8.5)
eGFR: 61 mL/min/{1.73_m2} (ref >59)

## 2022-06-26 LAB — CBC WITH DIFFERENTIAL/PLATELET
Basophils Absolute: 0.1 10*3/uL (ref 0.0–0.2)
Basos: 2 %
EOS (ABSOLUTE): 0.1 10*3/uL (ref 0.0–0.4)
Eos: 3 %
Hematocrit: 45.2 % (ref 34.0–46.6)
Hemoglobin: 14.9 g/dL (ref 11.1–15.9)
Immature Grans (Abs): 0 10*3/uL (ref 0.0–0.1)
Immature Granulocytes: 0 %
Lymphocytes Absolute: 1.6 10*3/uL (ref 0.7–3.1)
Lymphs: 34 %
MCH: 29.6 pg (ref 26.6–33.0)
MCHC: 33 g/dL (ref 31.5–35.7)
MCV: 90 fL (ref 79–97)
Monocytes Absolute: 0.4 10*3/uL (ref 0.1–0.9)
Monocytes: 9 %
Neutrophils Absolute: 2.4 10*3/uL (ref 1.4–7.0)
Neutrophils: 52 %
Platelets: 213 10*3/uL (ref 150–450)
RBC: 5.04 x10E6/uL (ref 3.77–5.28)
RDW: 12.4 % (ref 11.7–15.4)
WBC: 4.6 10*3/uL (ref 3.4–10.8)

## 2022-06-26 LAB — LIPID PANEL
Chol/HDL Ratio: 2.1 ratio (ref 0.0–4.4)
Cholesterol, Total: 149 mg/dL (ref 100–199)
HDL: 71 mg/dL (ref >39)
LDL Chol Calc (NIH): 57 mg/dL (ref 0–99)
Triglycerides: 123 mg/dL (ref 0–149)
VLDL Cholesterol Cal: 21 mg/dL (ref 5–40)

## 2022-06-26 LAB — THYROID PANEL WITH TSH
Free Thyroxine Index: 2.9 (ref 1.2–4.9)
T3 Uptake Ratio: 28 % (ref 24–39)
T4, Total: 10.3 ug/dL (ref 4.5–12.0)

## 2022-12-29 ENCOUNTER — Other Ambulatory Visit: Payer: Self-pay | Admitting: Nurse Practitioner

## 2022-12-29 DIAGNOSIS — E890 Postprocedural hypothyroidism: Secondary | ICD-10-CM

## 2022-12-30 ENCOUNTER — Ambulatory Visit: Payer: Medicare Other | Admitting: Nurse Practitioner

## 2022-12-30 ENCOUNTER — Encounter: Payer: Self-pay | Admitting: Nurse Practitioner

## 2022-12-30 VITALS — BP 126/76 | HR 55 | Temp 97.6°F | Resp 20 | Ht 64.0 in | Wt 199.0 lb

## 2022-12-30 DIAGNOSIS — E782 Mixed hyperlipidemia: Secondary | ICD-10-CM

## 2022-12-30 DIAGNOSIS — F3342 Major depressive disorder, recurrent, in full remission: Secondary | ICD-10-CM | POA: Diagnosis not present

## 2022-12-30 DIAGNOSIS — I10 Essential (primary) hypertension: Secondary | ICD-10-CM | POA: Diagnosis not present

## 2022-12-30 DIAGNOSIS — E890 Postprocedural hypothyroidism: Secondary | ICD-10-CM | POA: Diagnosis not present

## 2022-12-30 DIAGNOSIS — F411 Generalized anxiety disorder: Secondary | ICD-10-CM

## 2022-12-30 DIAGNOSIS — Z23 Encounter for immunization: Secondary | ICD-10-CM | POA: Diagnosis not present

## 2022-12-30 DIAGNOSIS — M8588 Other specified disorders of bone density and structure, other site: Secondary | ICD-10-CM

## 2022-12-30 MED ORDER — LOSARTAN POTASSIUM 100 MG PO TABS
100.0000 mg | ORAL_TABLET | Freq: Every day | ORAL | 1 refills | Status: DC
Start: 2022-12-30 — End: 2023-06-19

## 2022-12-30 MED ORDER — SIMVASTATIN 40 MG PO TABS
ORAL_TABLET | ORAL | 1 refills | Status: DC
Start: 2022-12-30 — End: 2023-06-19

## 2022-12-30 MED ORDER — DULOXETINE HCL 30 MG PO CPEP: 30.0000 mg | ORAL_CAPSULE | Freq: Every day | ORAL | 1 refills | Status: DC

## 2022-12-30 MED ORDER — LEVOTHYROXINE SODIUM 50 MCG PO TABS
50.0000 ug | ORAL_TABLET | Freq: Every day | ORAL | 1 refills | Status: DC
Start: 2022-12-30 — End: 2023-01-09

## 2022-12-30 NOTE — Progress Notes (Signed)
Subjective:    Patient ID: Linda Hernandez, female    DOB: [birth year], 72 y.o.   MRN: [id number]   Chief Complaint: medical management of chronic issues     HPI:  Linda Hernandez is a 72 y.o. who identifies as a female who was assigned female at birth.   Social history: Lives with: husband Work history: retired   Water engineer in today for follow up of the following chronic medical issues:  1. Primary hypertension No c/o chest pain, sob or headache. Does not check blood pressure at home. BP Readings from Last 3 Encounters:  06/25/22 116/73  12/24/21 (!) 144/80  06/12/21 115/67     2. Mixed hyperlipidemia Does try to watch diet but does not do any exercise. Lab Results  Component Value Date   CHOL 149 06/25/2022   HDL 71 06/25/2022   LDLCALC 57 06/25/2022   TRIG 123 06/25/2022   CHOLHDL 2.1 06/25/2022    3. Postoperative hypothyroidism No issues that she is aware of. Lab Results  Component Value Date   TSH 0.539 06/25/2022     4. Recurrent major depressive disorder, in full remission (HCC) Has been on cymbalta the last several years and is doing well.    12/30/2022    9:58 AM 06/25/2022    9:28 AM 12/25/2021    9:17 AM  Depression screen PHQ 2/9  Decreased Interest 0 0 0  Down, Depressed, Hopeless 0 0 0  PHQ - 2 Score 0 0 0  Altered sleeping 0 0 0  Tired, decreased energy 0 0 1  Change in appetite 0 0 0  Feeling bad or failure about yourself  0 0 0  Trouble concentrating 0 0 0  Moving slowly or fidgety/restless 0 0 0  Suicidal thoughts 0 0 0  PHQ-9 Score 0 0 1  Difficult doing work/chores Not difficult at all Not difficult at all Not difficult at all     5. GAD (generalized anxiety disorder) Cymbalta helps as well with her anxiety    12/30/2022    9:59 AM 06/25/2022    9:28 AM 12/24/2021   10:16 AM 06/12/2021    9:28 AM  GAD 7 : Generalized Anxiety Score  Nervous, Anxious, on Edge 0 0 0 0  Control/stop worrying 0 0 0 0  Worry too much - different things  0 0 0 0  Trouble relaxing 0 0 0 0  Restless 0 0 0 0  Easily annoyed or irritable 0 0 0 0  Afraid - awful might happen 0 0 0 0  Total GAD 7 Score 0 0 0 0  Anxiety Difficulty Not difficult at all Not difficult at all Not difficult at all Not difficult at all      6. Osteopenia of lumbar spine Last dexascan was done on 06/25/22. Her t score was  -1.3. she does no weig bearing  exercise.   7. Morbid obesity (HCC) Wt Readings from Last 3 Encounters:  12/30/22 199 lb (90.3 kg)  06/25/22 198 lb (89.8 kg)  12/24/21 199 lb (90.3 kg)   BMI Readings from Last 3 Encounters:  12/30/22 34.16 kg/m  06/25/22 33.99 kg/m  12/24/21 34.16 kg/m      New complaints: None today  Allergies  Allergen Reactions   Penicillins Rash   Outpatient Encounter Medications as of 12/30/2022  Medication Sig   Ascorbic Acid (VITAMIN C) 100 MG tablet Take 100 mg by mouth daily.   Calcium Carbonate (CALTRATE 600 PO) Take by  mouth.   Cholecalciferol (VITAMIN D) 2000 units tablet Take 2,000 Units by mouth daily.    cyanocobalamin 1000 MCG tablet Take 1,000 mcg by mouth daily.   DULoxetine (CYMBALTA) 30 MG capsule Take 1 capsule (30 mg total) by mouth daily.   fish oil-omega-3 fatty acids 1000 MG capsule Take 2 g by mouth daily.   levothyroxine (SYNTHROID) 50 MCG tablet Take 1 tablet (50 mcg total) by mouth daily.   losartan (COZAAR) 100 MG tablet Take 1 tablet (100 mg total) by mouth daily.   simvastatin (ZOCOR) 40 MG tablet TAKE 1 TABLET BY MOUTH DAILY AT 6 PM.   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  Eyes:  Negative for pain.  Respiratory:  Negative for shortness of breath.   Cardiovascular:  Negative for chest pain, palpitations and leg swelling.  Gastrointestinal:  Negative for abdominal pain.  Endocrine: Negative for polydipsia.  Skin:  Negative for rash.  Neurological:  Negative for dizziness, weakness and headaches.  Hematological:  Does not bruise/bleed easily.  All other systems reviewed and are negative.      Objective:   Physical Exam Vitals and nursing note reviewed.  Constitutional:      General: She is not in acute distress.    Appearance: Normal appearance. She is well-developed.  HENT:     Head: Normocephalic.     Right Ear: Tympanic membrane normal.     Left Ear: Tympanic membrane normal.     Nose: Nose normal.     Mouth/Throat:     Mouth: Mucous membranes are moist.  Eyes:     Pupils: Pupils are equal, round, and reactive to light.  Neck:     Vascular: No carotid bruit or JVD.  Cardiovascular:     Rate and Rhythm: Normal rate and regular rhythm.     Heart sounds: Normal heart sounds.  Pulmonary:     Effort: Pulmonary effort is normal. No respiratory distress.     Breath sounds: Normal breath sounds. No wheezing or rales.  Chest:     Chest wall: No tenderness.  Abdominal:     General: Bowel sounds are normal. There is no distension or abdominal bruit.     Palpations: Abdomen is  soft. There is no hepatomegaly, splenomegaly, mass or  pulsatile mass.     Tenderness: There is no abdominal tenderness.  Musculoskeletal:        General: Normal range of motion.     Cervical back: Normal range of motion and neck supple.  Lymphadenopathy:     Cervical: No cervical adenopathy.  Skin:    General: Skin is warm and dry.  Neurological:     Mental Status: She is alert and oriented to person, place, and time.     Deep Tendon Reflexes: Reflexes are normal and symmetric.  Psychiatric:        Behavior: Behavior normal.        Thought Content: Thought content normal.        Judgment: Judgment normal.    BP 126/76   Pulse (!) 55   Temp 97.6 F (36.4 C) (Temporal)   Resp 20   Ht 5\' 4"  (1.626 m)   Wt 199 lb (90.3 kg)   LMP 06/01/1997   SpO2 94%   BMI 34.16 kg/m         Assessment & Plan:   Linda Hernandez comes in today with chief complaint of Medical Management of Chronic Issues   Diagnosis and orders addressed:  1. Primary hypertension Low sodium diet - losartan (COZAAR) 100 MG tablet; Take 1 tablet (100 mg total) by mouth daily.  Dispense: 90 tablet; Refill: 1 - CBC with Differential/Platelet - CMP14+EGFR  2. Mixed hyperlipidemia Low fat diet - simvastatin (ZOCOR) 40 MG tablet; TAKE 1 TABLET BY MOUTH DAILY AT 6 PM.  Dispense: 90 tablet; Refill: 1 - Lipid panel  3. Postoperative hypothyroidism Labs pending - levothyroxine (SYNTHROID) 50 MCG tablet; Take 1 tablet (50 mcg total) by mouth daily.  Dispense: 90 tablet; Refill: 1 - Thyroid Panel With TSH  4. Recurrent major depressive disorder, in full remission (HCC) Stress management - DULoxetine (CYMBALTA) 30 MG capsule; Take 1 capsule (30 mg total) by mouth daily.  Dispense: 90 capsule; Refill: 1  5. GAD (generalized anxiety disorder)  6. Osteopenia of lumbar spine Weight bearing exercises encouraged  7. Morbid obesity (HCC) Discussed diet and exercise for person with BMI >25 Will recheck weight in 3-6 months    Labs pending Health Maintenance  reviewed Diet and exercise encouraged  Follow up plan: 6 months   Mary-Margaret Daphine Deutscher, FNP

## 2022-12-30 NOTE — Patient Instructions (Signed)

## 2022-12-31 LAB — CBC WITH DIFFERENTIAL/PLATELET
Basophils Absolute: 0.1 10*3/uL (ref 0.0–0.2)
Basos: 1 %
EOS (ABSOLUTE): 0.1 10*3/uL (ref 0.0–0.4)
Eos: 3 %
Hematocrit: 49.1 % — ABNORMAL HIGH (ref 34.0–46.6)
Hemoglobin: 16 g/dL — ABNORMAL HIGH (ref 11.1–15.9)
Immature Grans (Abs): 0 10*3/uL (ref 0.0–0.1)
Immature Granulocytes: 0 %
Lymphocytes Absolute: 1.7 10*3/uL (ref 0.7–3.1)
Lymphs: 32 %
MCH: 30 pg (ref 26.6–33.0)
MCHC: 32.6 g/dL (ref 31.5–35.7)
MCV: 92 fL (ref 79–97)
Monocytes Absolute: 0.5 10*3/uL (ref 0.1–0.9)
Monocytes: 9 %
Neutrophils Absolute: 2.8 10*3/uL (ref 1.4–7.0)
Neutrophils: 55 %
Platelets: 216 10*3/uL (ref 150–450)
RBC: 5.34 x10E6/uL — ABNORMAL HIGH (ref 3.77–5.28)
RDW: 12.4 % (ref 11.7–15.4)
WBC: 5.2 10*3/uL (ref 3.4–10.8)

## 2022-12-31 LAB — CMP14+EGFR
ALT: 16 [IU]/L (ref 0–32)
AST: 19 [IU]/L (ref 0–40)
Albumin: 4.3 g/dL (ref 3.9–4.9)
Alkaline Phosphatase: 88 IU/L (ref 44–121)
BUN/Creatinine Ratio: 12 (ref 12–28)
BUN: 13 mg/dL (ref 8–27)
Bilirubin Total: 0.9 mg/dL (ref 0.0–1.2)
CO2: 26 mmol/L (ref 20–29)
Calcium: 11 mg/dL — ABNORMAL HIGH (ref 8.7–10.3)
Chloride: 101 mmol/L (ref 96–106)
Creatinine, Ser: 1.08 mg/dL — ABNORMAL HIGH (ref 0.57–1.00)
Globulin, Total: 2.3 g/dL (ref 1.5–4.5)
Glucose: 121 mg/dL — ABNORMAL HIGH (ref 70–99)
Potassium: 5 mmol/L (ref 3.5–5.2)
Sodium: 139 mmol/L (ref 134–144)
Total Protein: 6.6 g/dL (ref 6.0–8.5)
eGFR: 55 mL/min/{1.73_m2} — ABNORMAL LOW (ref >59)

## 2022-12-31 LAB — LIPID PANEL
Chol/HDL Ratio: 2.1 ratio (ref 0.0–4.4)
Cholesterol, Total: 153 mg/dL (ref 100–199)
HDL: 74 mg/dL (ref >39)
LDL Chol Calc (NIH): 57 mg/dL (ref 0–99)
Triglycerides: 127 mg/dL (ref 0–149)
VLDL Cholesterol Cal: 22 mg/dL (ref 5–40)

## 2022-12-31 LAB — THYROID PANEL WITH TSH
Free Thyroxine Index: 3.5 (ref 1.2–4.9)
T3 Uptake Ratio: 30 % (ref 24–39)
T4, Total: 11.8 ug/dL (ref 4.5–12.0)
TSH: 0.908 u[IU]/mL (ref 0.450–4.500)

## 2023-01-09 ENCOUNTER — Other Ambulatory Visit: Payer: Self-pay | Admitting: Nurse Practitioner

## 2023-01-09 DIAGNOSIS — E890 Postprocedural hypothyroidism: Secondary | ICD-10-CM

## 2023-01-27 ENCOUNTER — Ambulatory Visit (INDEPENDENT_AMBULATORY_CARE_PROVIDER_SITE_OTHER): Payer: Medicare Other

## 2023-01-27 VITALS — Ht 64.0 in | Wt 199.0 lb

## 2023-01-27 DIAGNOSIS — Z0000 Encounter for general adult medical examination without abnormal findings: Secondary | ICD-10-CM | POA: Diagnosis not present

## 2023-01-27 NOTE — Patient Instructions (Signed)
Linda Hernandez , Thank you for taking time to come for your Medicare Wellness Visit. I appreciate your ongoing commitment to your health goals. Please review the following plan we discussed and let me know if I can assist you in the future.   Referrals/Orders/Follow-Ups/Clinician Recommendations: Aim for 30 minutes of exercise or brisk walking, 6-8 glasses of water, and 5 servings of fruits and vegetables each day.   This is a list of the screening recommended for you and due dates:  Health Maintenance  Topic Date Due   COVID-19 Vaccine (5 - 2023-24 season) 11/10/2022   Colon Cancer Screening  06/20/2023   DTaP/Tdap/Td vaccine (2 - Td or Tdap) 12/28/2023   Medicare Annual Wellness Visit  01/27/2024   Mammogram  05/01/2024   DEXA scan (bone density measurement)  06/24/2024   Pneumonia Vaccine  Completed   Flu Shot  Completed   Hepatitis C Screening  Completed   Zoster (Shingles) Vaccine  Completed   HPV Vaccine  Aged Out    Advanced directives: (Copy Requested) Please bring a copy of your health care power of attorney and living will to the office to be added to your chart at your convenience.  Next Medicare Annual Wellness Visit scheduled for next year: Yes  Insert Preventive Care attachment Insert FALL PREVENTION attachment if needed

## 2023-01-27 NOTE — Progress Notes (Signed)
Subjective:   Linda Hernandez is a 72 y.o. female who presents for Medicare Annual (Subsequent) preventive examination.  Visit Complete: Virtual I connected with  Linda Hernandez on 01/27/23 by a audio enabled telemedicine application and verified that I am speaking with the correct person using two identifiers.  Patient Location: Home  Provider Location: Home Office  I discussed the limitations of evaluation and management by telemedicine. The patient expressed understanding and agreed to proceed.  Vital Signs: Because this visit was a virtual/telehealth visit, some criteria may be missing or patient reported. Any vitals not documented were not able to be obtained and vitals that have been documented are patient reported.  Patient Medicare AWV questionnaire was completed by the patient on 01/27/2023; I have confirmed that all information answered by patient is correct and no changes since this date.  Cardiac Risk Factors include: advanced age (>24men, >26 women)     Objective:    Today's Vitals   01/27/23 1001  Weight: 199 lb (90.3 kg)  Height: 5\' 4"  (1.626 m)   Body mass index is 34.16 kg/m.     01/27/2023   10:07 AM 12/25/2021    9:23 AM 12/14/2020    9:47 AM 09/18/2018    2:08 PM 06/19/2016    8:41 AM 06/05/2016    9:59 AM  Advanced Directives  Does Patient Have a Medical Advance Directive? Yes Yes Yes No No No  Type of Estate agent of [place];Living will Healthcare Power of [place];Living will Healthcare Power of [place];Living will     Does patient want to make changes to medical advance directive?  No - Patient declined      Copy of Healthcare Power of Attorney in Chart? No - copy requested No - copy requested No - copy requested     Would patient like information on creating a medical advance directive?    No - Patient declined      Current Medications (verified) Outpatient Encounter Medications as of 01/27/2023  Medication Sig   Ascorbic Acid  (VITAMIN C) 100 MG tablet Take 100 mg by mouth daily.   Calcium Carbonate (CALTRATE 600 PO) Take by mouth.   Cholecalciferol (VITAMIN D) 2000 units tablet Take 2,000 Units by mouth daily.    cyanocobalamin 1000 MCG tablet Take 1,000 mcg by mouth daily.   DULoxetine (CYMBALTA) 30 MG capsule Take 1 capsule (30 mg total) by mouth daily.   fish oil-omega-3 fatty acids 1000 MG capsule Take 2 g by mouth daily.   levothyroxine (SYNTHROID) 50 MCG tablet TAKE 1 TABLET BY MOUTH EVERY DAY   losartan (COZAAR) 100 MG tablet Take 1 tablet (100 mg total) by mouth daily.   simvastatin (ZOCOR) 40 MG tablet TAKE 1 TABLET BY MOUTH DAILY AT 6 PM.   Facility-Administered Encounter Medications as of 01/27/2023  Medication   0.9 %  sodium chloride infusion    Allergies (verified) Penicillins   History: Past Medical History:  Diagnosis Date   Allergy Childhood   Penicillin   Anxiety    Cataract January 2023   In process of cataract removal   Hx of adenomatous colonic polyps 06/19/2016   Hyperlipidemia    Hypertension    Snoring    Thyroid disease    thyroid tumor   Past Surgical History:  Procedure Laterality Date   ABDOMINAL HYSTERECTOMY     CATARACT EXTRACTION Left    COLONOSCOPY     DENTAL RESTORATION/EXTRACTION WITH X-RAY     DILATION  AND CURETTAGE OF UTERUS     EYE SURGERY  December 21, 2021   Cataract removal   MOHS SURGERY     TUMOR EXCISION     thyroid 1975   tumor of thyroid gland     Family History  Problem Relation Age of Onset   Diabetes Mother        pre diabetic  per patient   Diabetes Father    Heart disease Father    Hypertension Father    Diabetes Sister    Down syndrome Sister    Intellectual disability Sister    Hypertension Brother    Diabetes Brother    Obesity Brother    Diabetes Brother    Colon cancer Paternal Uncle        60's   Colon cancer Paternal Grandmother        89's   Breast cancer Cousin    Esophageal cancer Neg Hx    Rectal cancer Neg Hx     Stomach cancer Neg Hx    Social History   Socioeconomic History   Marital status: Married    Spouse name: Dorinda Hill   Number of children: 3   Years of education: 12   Highest education level: 12th grade  Occupational History   Occupation: retired  Tobacco Use   Smoking status: Never   Smokeless tobacco: Never  Vaping Use   Vaping status: Never Used  Substance and Sexual Activity   Alcohol use: Not Currently    Comment: extremely rare 2 times yearly   Drug use: Never   Sexual activity: Yes    Partners: Male    Birth control/protection: Surgical, Other-see comments  Other Topics Concern   Not on file  Social History Narrative   Lives in one level home with her husband - he is a Programmer, multimedia.   Son lives 13 miles away.   Social Determinants of Health   Financial Resource Strain: Low Risk  (01/27/2023)   Overall Financial Resource Strain (CARDIA)    Difficulty of Paying Living Expenses: Not hard at all  Food Insecurity: No Food Insecurity (01/27/2023)   Hunger Vital Sign    Worried About Running Out of Food in the Last Year: Never true    Ran Out of Food in the Last Year: Never true  Transportation Needs: No Transportation Needs (01/27/2023)   PRAPARE - Administrator, Civil Service (Medical): No    Lack of Transportation (Non-Medical): No  Physical Activity: Insufficiently Active (01/27/2023)   Exercise Vital Sign    Days of Exercise per Week: 1 day    Minutes of Exercise per Session: 20 min  Stress: No Stress Concern Present (01/27/2023)   [employer] of Occupational Health - Occupational Stress Questionnaire    Feeling of Stress : Not at all  Social Connections: Moderately Integrated (01/27/2023)   Social Connection and Isolation Panel [NHANES]    Frequency of Communication with Friends and Family: More than three times a week    Frequency of Social Gatherings with Friends and Family: More than three times a week    Attends Religious Services: More  than 4 times per year    Active Member of [employer] or Organizations: No    Attends Banker Meetings: Never    Marital Status: Married    Tobacco Counseling Counseling given: Not Answered   Clinical Intake:  Pre-visit preparation completed: Yes  Pain : No/denies pain     Nutritional Risks: None Diabetes: No  How often do you need to have someone help you when you read instructions, pamphlets, or other written materials from your doctor or pharmacy?: 1 - Never  Interpreter Needed?: No  Information entered by :: Renie Ora, LPN   Activities of Daily Living    01/27/2023   10:07 AM 01/26/2023    8:50 PM  In your present state of health, do you have any difficulty performing the following activities:  Hearing? 0 0  Vision? 0 0  Difficulty concentrating or making decisions? 0 0  Walking or climbing stairs? 0 0  Dressing or bathing? 0 0  Doing errands, shopping? 0 0  Preparing Food and eating ? N N  Using the Toilet? N N  In the past six months, have you accidently leaked urine? N N  Do you have problems with loss of bowel control? N N  Managing your Medications? N N  Managing your Finances? N N  Housekeeping or managing your Housekeeping? N N    Patient Care Team: Bennie Pierini, FNP as PCP - General (Nurse Practitioner) Conley Rolls, [facility] [place], [place] as Referring Physician (Optometry)  Indicate any recent Medical Services you may have received from other than [facility] providers in the past year (date may be approximate).     Assessment:   This is a routine wellness examination for Linda Hernandez.  Hearing/Vision screen Vision Screening - Comments:: Wears rx glasses - up to date with routine eye exams with  Dr.Le    Goals Addressed             This Visit's Progress    DIET - EAT MORE FRUITS AND VEGETABLES         Depression Screen    01/27/2023   10:05 AM 12/30/2022    9:58 AM 06/25/2022    9:28 AM 12/25/2021    9:17 AM 12/24/2021   10:16 AM 06/12/2021     9:27 AM 12/14/2020    9:07 AM  PHQ 2/9 Scores  PHQ - 2 Score 0 0 0 0 0 0 0  PHQ- 9 Score 0 0 0 1 0 0     Fall Risk    01/27/2023   10:03 AM 01/26/2023    8:50 PM 12/30/2022    9:58 AM 12/26/2022    8:43 PM 06/25/2022    9:28 AM  Fall Risk   Falls in the past year? 0 0 0 0 0  Number falls in past yr: 0 0  0   Injury with Fall? 0 0  0   Risk for fall due to : No Fall Risks      Follow up Falls prevention discussed        MEDICARE RISK AT HOME: Medicare Risk at Home Any stairs in or around the home?: No If so, are there any without handrails?: No Home free of loose throw rugs in walkways, pet beds, electrical cords, etc?: Yes Adequate lighting in your home to reduce risk of falls?: Yes Life alert?: No Use of a cane, walker or w/c?: No Grab bars in the bathroom?: No Shower chair or bench in shower?: No Elevated toilet seat or a handicapped toilet?: No  TIMED UP AND GO:  Was the test performed?  No    Cognitive Function:    09/15/2017   10:12 AM  MMSE - Mini Mental State Exam  Orientation to time 5  Orientation to Place 5  Registration 3  Attention/ Calculation 5  Recall 3  Language- name 2 objects 2  Language- repeat 1  Language- follow 3 step command 3  Language- read & follow direction 1  Write a sentence 1  Copy design 1  Total score 30        01/27/2023   10:08 AM 12/25/2021    9:27 AM 09/18/2018    2:10 PM  6CIT Screen  What Year? 0 points 0 points 0 points  What month? 0 points 0 points 0 points  What time? 0 points 0 points 0 points  Count back from 20 0 points 0 points 2 points  Months in reverse 0 points 2 points 0 points  Repeat phrase 0 points 0 points 2 points  Total Score 0 points 2 points 4 points    Immunizations Immunization History  Administered Date(s) Administered   Fluad Quad(high Dose 65+) 11/17/2018, 11/30/2019, 12/04/2020, 12/24/2021   Fluad Trivalent(High Dose 65+) 12/30/2022   Influenza, High Dose Seasonal PF 01/12/2018    Influenza,inj,Quad PF,6+ Mos 12/27/2013, 01/13/2015, 12/23/2016   Influenza-Unspecified 12/13/2016   PFIZER(Purple Top)SARS-COV-2 Vaccination 04/25/2019, 05/18/2019, 12/29/2019, 06/28/2020   Pneumococcal Conjugate-13 04/29/2016, 11/17/2018   Pneumococcal Polysaccharide-23 08/22/2017   Tdap 12/27/2013   Zoster Recombinant(Shingrix) 09/15/2017, 02/24/2018    TDAP status: Up to date  Flu Vaccine status: Up to date  Pneumococcal vaccine status: Up to date  Covid-19 vaccine status: Completed vaccines  Qualifies for Shingles Vaccine? Yes   Zostavax completed Yes   Shingrix Completed?: Yes  Screening Tests Health Maintenance  Topic Date Due   COVID-19 Vaccine (5 - 2023-24 season) 11/10/2022   Colonoscopy  06/20/2023   DTaP/Tdap/Td (2 - Td or Tdap) 12/28/2023   Medicare Annual Wellness (AWV)  01/27/2024   MAMMOGRAM  05/01/2024   DEXA SCAN  06/24/2024   Pneumonia Vaccine 82+ Years old  Completed   INFLUENZA VACCINE  Completed   Hepatitis C Screening  Completed   Zoster Vaccines- Shingrix  Completed   HPV VACCINES  Aged Out    Health Maintenance  Health Maintenance Due  Topic Date Due   COVID-19 Vaccine (5 - 2023-24 season) 11/10/2022    Colorectal cancer screening: Type of screening: Colonoscopy. Completed 06/19/2016. Repeat every 7 years  Mammogram status: Completed 05/01/2022. Repeat every year  Bone Density status: Completed 06/25/2022. Results reflect: Bone density results: OSTEOPOROSIS. Repeat every 2 years.  Lung Cancer Screening: (Low Dose CT Chest recommended if Age 27-80 years, 20 pack-year currently smoking OR have quit w/in 15years.) does not qualify.   Lung Cancer Screening Referral: n/a  Additional Screening:  Hepatitis C Screening: does not qualify; Completed 01/12/2015  Vision Screening: Recommended annual ophthalmology exams for early detection of glaucoma and other disorders of the eye. Is the patient up to date with their annual eye exam?  Yes  Who  is the provider or what is the name of the office in which the patient attends annual eye exams? Dr.Le  If pt is not established with a provider, would they like to be referred to a provider to establish care? No .   Dental Screening: Recommended annual dental exams for proper oral hygiene   Community Resource Referral / Chronic Care Management: CRR required this visit?  No   CCM required this visit?  No     Plan:     I have personally reviewed and noted the following in the patient's chart:   Medical and social history Use of alcohol, tobacco or illicit drugs  Current medications and supplements including opioid prescriptions. Patient is not currently taking opioid prescriptions. Functional  ability and status Nutritional status Physical activity Advanced directives List of other physicians Hospitalizations, surgeries, and ER visits in previous 12 months Vitals Screenings to include cognitive, depression, and falls Referrals and appointments  In addition, I have reviewed and discussed with patient certain preventive protocols, quality metrics, and best practice recommendations. A written personalized care plan for preventive services as well as general preventive health recommendations were provided to patient.     Lorrene Reid, LPN   16/12/9602   After Visit Summary: (MyChart) Due to this being a telephonic visit, the after visit summary with patients personalized plan was offered to patient via MyChart   Nurse Notes: none

## 2023-03-25 ENCOUNTER — Other Ambulatory Visit: Payer: Self-pay | Admitting: Nurse Practitioner

## 2023-03-25 DIAGNOSIS — Z1231 Encounter for screening mammogram for malignant neoplasm of breast: Secondary | ICD-10-CM

## 2023-05-05 ENCOUNTER — Ambulatory Visit: Payer: Medicare Other

## 2023-05-06 ENCOUNTER — Encounter: Payer: Self-pay | Admitting: *Deleted

## 2023-05-07 ENCOUNTER — Ambulatory Visit
Admission: RE | Admit: 2023-05-07 | Discharge: 2023-05-07 | Disposition: A | Discharge status: Home / Self Care | Payer: Medicare Other | Source: Ambulatory Visit | Attending: Nurse Practitioner | Admitting: Nurse Practitioner

## 2023-05-07 DIAGNOSIS — Z1231 Encounter for screening mammogram for malignant neoplasm of breast: Secondary | ICD-10-CM

## 2023-06-19 ENCOUNTER — Ambulatory Visit: Admitting: Nurse Practitioner

## 2023-06-19 ENCOUNTER — Encounter: Payer: Self-pay | Admitting: Nurse Practitioner

## 2023-06-19 VITALS — BP 129/65 | HR 72 | Temp 97.3°F | Ht 64.0 in | Wt 201.0 lb

## 2023-06-19 DIAGNOSIS — E890 Postprocedural hypothyroidism: Secondary | ICD-10-CM | POA: Diagnosis not present

## 2023-06-19 DIAGNOSIS — I10 Essential (primary) hypertension: Secondary | ICD-10-CM

## 2023-06-19 DIAGNOSIS — F411 Generalized anxiety disorder: Secondary | ICD-10-CM

## 2023-06-19 DIAGNOSIS — E782 Mixed hyperlipidemia: Secondary | ICD-10-CM

## 2023-06-19 DIAGNOSIS — M8588 Other specified disorders of bone density and structure, other site: Secondary | ICD-10-CM

## 2023-06-19 DIAGNOSIS — F3342 Major depressive disorder, recurrent, in full remission: Secondary | ICD-10-CM

## 2023-06-19 MED ORDER — LOSARTAN POTASSIUM 100 MG PO TABS: 100.0000 mg | ORAL_TABLET | Freq: Every day | ORAL | 1 refills | Status: DC

## 2023-06-19 MED ORDER — SIMVASTATIN 40 MG PO TABS: ORAL_TABLET | ORAL | 1 refills | Status: DC

## 2023-06-19 MED ORDER — DULOXETINE HCL 30 MG PO CPEP
30.0000 mg | ORAL_CAPSULE | Freq: Every day | ORAL | 1 refills | Status: DC
Start: 2023-06-19 — End: 2023-12-29

## 2023-06-19 MED ORDER — LEVOTHYROXINE SODIUM 50 MCG PO TABS: 50.0000 ug | ORAL_TABLET | Freq: Every day | ORAL | 1 refills | Status: DC

## 2023-06-19 NOTE — Patient Instructions (Signed)

## 2023-06-19 NOTE — Progress Notes (Signed)
 Subjective:    Patient ID: Linda Hernandez, female    DOB: 01/08/1951, 73 y.o.   MRN: [id number]   Chief Complaint: medical management of chronic issues     HPI:  Linda Hernandez is a 73 y.o. who identifies as a female who was assigned female at birth.   Social history: Lives with: husband Work history: retired   Water engineer in today for follow up of the following chronic medical issues:  1. Primary hypertension No c/o chest pain, sob or headache. Does not check blood pressure at home. BP Readings from Last 3 Encounters:  12/30/22 126/76  06/25/22 116/73  12/24/21 (!) 144/80     2. Mixed hyperlipidemia Does try to watch diet but does not do any exercise. Lab Results  Component Value Date   CHOL 153 12/30/2022   HDL 74 12/30/2022   LDLCALC 57 12/30/2022   TRIG 127 12/30/2022   CHOLHDL 2.1 12/30/2022    3. Postoperative hypothyroidism No issues that she is aware of. Lab Results  Component Value Date   TSH 0.908 12/30/2022     4. Recurrent major depressive disorder, in full remission (HCC) Has been on cymbalta the last several years and is doing well.    06/19/2023   12:25 PM 06/19/2023   12:24 PM 01/27/2023   10:05 AM  Depression screen PHQ 2/9  Decreased Interest  0 0  Down, Depressed, Hopeless 0 0 0  PHQ - 2 Score 0 0 0  Altered sleeping 0  0  Tired, decreased energy 0  0  Change in appetite 0  0  Feeling bad or failure about yourself  0  0  Trouble concentrating 0  0  Moving slowly or fidgety/restless 0  0  Suicidal thoughts 0  0  PHQ-9 Score 0  0  Difficult doing work/chores Not difficult at all  Not difficult at all      5. GAD (generalized anxiety disorder) Cymbalta helps as well with her anxiety    12/30/2022    9:59 AM 06/25/2022    9:28 AM 12/24/2021   10:16 AM 06/12/2021    9:28 AM  GAD 7 : Generalized Anxiety Score  Nervous, Anxious, on Edge 0 0 0 0  Control/stop worrying 0 0 0 0  Worry too much - different things 0 0 0 0  Trouble relaxing  0 0 0 0  Restless 0 0 0 0  Easily annoyed or irritable 0 0 0 0  Afraid - awful might happen 0 0 0 0  Total GAD 7 Score 0 0 0 0  Anxiety Difficulty Not difficult at all Not difficult at all Not difficult at all Not difficult at all        6. Osteopenia of lumbar spine Last dexascan was done on 06/25/22. Her t score was  -1.3. she does no weig bearing  exercise.   7. Morbid obesity (HCC) Wt Readings from Last 3 Encounters:  06/19/23 201 lb (91.2 kg)  01/27/23 199 lb (90.3 kg)  12/30/22 199 lb (90.3 kg)   BMI Readings from Last 3 Encounters:  06/19/23 34.50 kg/m  01/27/23 34.16 kg/m  12/30/22 34.16 kg/m        New complaints: None today  Allergies  Allergen Reactions   Penicillins Rash   Outpatient Encounter Medications as of 06/19/2023  Medication Sig   Ascorbic Acid (VITAMIN C) 100 MG tablet Take 100 mg by mouth daily.   Calcium Carbonate (CALTRATE 600 PO) Take by mouth.  Cholecalciferol (VITAMIN D) 2000 units tablet Take 2,000 Units by mouth daily.    cyanocobalamin 1000 MCG tablet Take 1,000 mcg by mouth daily.   DULoxetine (CYMBALTA) 30 MG capsule Take 1 capsule (30 mg total) by mouth daily.   fish oil-omega-3 fatty acids 1000 MG capsule Take 2 g by mouth daily.   levothyroxine (SYNTHROID) 50 MCG tablet TAKE 1 TABLET BY MOUTH EVERY DAY   losartan (COZAAR) 100 MG tablet Take 1 tablet (100 mg total) by mouth daily.   simvastatin (ZOCOR) 40 MG tablet TAKE 1 TABLET BY MOUTH DAILY AT 6 PM.   Facility-Administered Encounter Medications as of 06/19/2023  Medication   0.9 %  sodium chloride infusion    Past Surgical History:  Procedure Laterality Date   ABDOMINAL HYSTERECTOMY     CATARACT EXTRACTION Left    COLONOSCOPY     DENTAL RESTORATION/EXTRACTION WITH X-RAY     DILATION AND CURETTAGE OF UTERUS     EYE SURGERY  December 21, 2021   Cataract removal   MOHS SURGERY     TUMOR EXCISION     thyroid 1975   tumor of thyroid gland      Family History   Problem Relation Age of Onset   Diabetes Mother        pre diabetic  per patient   Diabetes Father    Heart disease Father    Hypertension Father    Diabetes Sister    Down syndrome Sister    Intellectual disability Sister    Hypertension Brother    Diabetes Brother    Obesity Brother    Diabetes Brother    Colon cancer Paternal Uncle        60's   Colon cancer Paternal Grandmother        33's   Breast cancer Cousin    Esophageal cancer Neg Hx    Rectal cancer Neg Hx    Stomach cancer Neg Hx       Controlled substance contract: n/a     Review of Systems  Constitutional:  Negative for diaphoresis.  Eyes:  Negative for pain.  Respiratory:  Negative for shortness of breath.   Cardiovascular:  Negative for chest pain, palpitations and leg swelling.  Gastrointestinal:  Negative for abdominal pain.  Endocrine: Negative for polydipsia.  Skin:  Negative for rash.  Neurological:  Negative for dizziness, weakness and headaches.  Hematological:  Does not bruise/bleed easily.  All other systems reviewed and are negative.      Objective:   Physical Exam Vitals and nursing note reviewed.  Constitutional:      General: She is not in acute distress.    Appearance: Normal appearance. She is well-developed.  HENT:     Head: Normocephalic.     Right Ear: Tympanic membrane normal.     Left Ear: Tympanic membrane normal.     Nose: Nose normal.     Mouth/Throat:     Mouth: Mucous membranes are moist.  Eyes:     Pupils: Pupils are equal, round, and reactive to light.  Neck:     Vascular: No carotid bruit or JVD.  Cardiovascular:     Rate and Rhythm: Normal rate and regular rhythm.     Heart sounds: Normal heart sounds.  Pulmonary:     Effort: Pulmonary effort is normal. No respiratory distress.     Breath sounds: Normal breath sounds. No wheezing or rales.  Chest:     Chest wall: No tenderness.  Abdominal:  General: Bowel sounds are normal. There is no distension  or abdominal bruit.     Palpations: Abdomen is soft. There is no hepatomegaly, splenomegaly, mass or pulsatile mass.     Tenderness: There is no abdominal tenderness.  Musculoskeletal:        General: Normal range of motion.     Cervical back: Normal range of motion and neck supple.  Lymphadenopathy:     Cervical: No cervical adenopathy.  Skin:    General: Skin is warm and dry.  Neurological:     Mental Status: She is alert and oriented to person, place, and time.     Deep Tendon Reflexes: Reflexes are normal and symmetric.  Psychiatric:        Behavior: Behavior normal.        Thought Content: Thought content normal.        Judgment: Judgment normal.    LMP 06/01/1997   BP 129/65   Pulse 72   Temp (!) 97.3 F (36.3 C) (Temporal)   Ht 5\' 4"  (1.626 m)   Wt 201 lb (91.2 kg)   LMP 06/01/1997   SpO2 97%   BMI 34.50 kg/m        Assessment & Plan:   Kennidy A Hinchcliff comes in today with chief complaint of No chief complaint on file.   Diagnosis and orders addressed:  1. Primary hypertension Low sodium diet - losartan (COZAAR) 100 MG tablet; Take 1 tablet (100 mg total) by mouth daily.  Dispense: 90 tablet; Refill: 1 - CBC with Differential/Platelet - CMP14+EGFR  2. Mixed hyperlipidemia Low fat diet - simvastatin (ZOCOR) 40 MG tablet; TAKE 1 TABLET BY MOUTH DAILY AT 6 PM.  Dispense: 90 tablet; Refill: 1 - Lipid panel  3. Postoperative hypothyroidism Labs pending - levothyroxine (SYNTHROID) 50 MCG tablet; Take 1 tablet (50 mcg total) by mouth daily.  Dispense: 90 tablet; Refill: 1 - Thyroid Panel With TSH  4. Recurrent major depressive disorder, in full remission (HCC) Stress management - DULoxetine (CYMBALTA) 30 MG capsule; Take 1 capsule (30 mg total) by mouth daily.  Dispense: 90 capsule; Refill: 1  5. GAD (generalized anxiety disorder)  6. Osteopenia of lumbar spine Weight bearing exercises encouraged  7. Morbid obesity (HCC) Discussed diet and exercise for  person with BMI >25 Will recheck weight in 3-6 months    Labs pending Health Maintenance reviewed Diet and exercise encouraged  Follow up plan: 6 months   Mary-Margaret Daphine Deutscher, FNP

## 2023-06-20 ENCOUNTER — Ambulatory Visit: Admitting: Nurse Practitioner

## 2023-06-20 LAB — LIPID PANEL
Chol/HDL Ratio: 1.9 ratio (ref 0.0–4.4)
Cholesterol, Total: 140 mg/dL (ref 100–199)
HDL: 73 mg/dL (ref >39)
LDL Chol Calc (NIH): 47 mg/dL (ref 0–99)
Triglycerides: 113 mg/dL (ref 0–149)
VLDL Cholesterol Cal: 20 mg/dL (ref 5–40)

## 2023-06-20 LAB — CMP14+EGFR
ALT: 15 IU/L (ref 0–32)
AST: 19 IU/L (ref 0–40)
Albumin: 4.3 g/dL (ref 3.8–4.8)
Alkaline Phosphatase: 88 IU/L (ref 44–121)
BUN/Creatinine Ratio: 18 (ref 12–28)
BUN: 18 mg/dL (ref 8–27)
Bilirubin Total: 0.9 mg/dL (ref 0.0–1.2)
CO2: 22 mmol/L (ref 20–29)
Calcium: 11 mg/dL — ABNORMAL HIGH (ref 8.7–10.3)
Chloride: 101 mmol/L (ref 96–106)
Creatinine, Ser: 0.98 mg/dL (ref 0.57–1.00)
Globulin, Total: 2.5 g/dL (ref 1.5–4.5)
Glucose: 125 mg/dL — ABNORMAL HIGH (ref 70–99)
Potassium: 5 mmol/L (ref 3.5–5.2)
Sodium: 137 mmol/L (ref 134–144)
Total Protein: 6.8 g/dL (ref 6.0–8.5)
eGFR: 62 mL/min/{1.73_m2} (ref >59)

## 2023-06-20 LAB — CBC WITH DIFFERENTIAL/PLATELET
Basophils Absolute: 0.1 10*3/uL (ref 0.0–0.2)
Basos: 1 %
EOS (ABSOLUTE): 0.1 10*3/uL (ref 0.0–0.4)
Eos: 2 %
Hematocrit: 46.3 % (ref 34.0–46.6)
Hemoglobin: 15.3 g/dL (ref 11.1–15.9)
Immature Grans (Abs): 0 10*3/uL (ref 0.0–0.1)
Immature Granulocytes: 0 %
Lymphocytes Absolute: 1.6 10*3/uL (ref 0.7–3.1)
Lymphs: 31 %
MCH: 30.5 pg (ref 26.6–33.0)
MCHC: 33 g/dL (ref 31.5–35.7)
MCV: 92 fL (ref 79–97)
Monocytes Absolute: 0.4 10*3/uL (ref 0.1–0.9)
Monocytes: 8 %
Neutrophils Absolute: 2.9 10*3/uL (ref 1.4–7.0)
Neutrophils: 58 %
Platelets: 211 10*3/uL (ref 150–450)
RBC: 5.01 x10E6/uL (ref 3.77–5.28)
RDW: 13.2 % (ref 11.7–15.4)
WBC: 5 10*3/uL (ref 3.4–10.8)

## 2023-06-20 LAB — THYROID PANEL WITH TSH
Free Thyroxine Index: 3.5 (ref 1.2–4.9)
T3 Uptake Ratio: 29 % (ref 24–39)
T4, Total: 12.1 ug/dL — ABNORMAL HIGH (ref 4.5–12.0)
TSH: 0.598 u[IU]/mL (ref 0.450–4.500)

## 2023-06-24 ENCOUNTER — Ambulatory Visit: Payer: Medicare Other | Admitting: Nurse Practitioner

## 2023-07-01 ENCOUNTER — Ambulatory Visit: Payer: Medicare Other | Admitting: Nurse Practitioner

## 2023-11-24 ENCOUNTER — Ambulatory Visit: Admitting: Nurse Practitioner

## 2023-12-09 ENCOUNTER — Other Ambulatory Visit: Payer: Self-pay | Admitting: Nurse Practitioner

## 2023-12-09 DIAGNOSIS — E890 Postprocedural hypothyroidism: Secondary | ICD-10-CM

## 2023-12-29 ENCOUNTER — Ambulatory Visit: Admitting: Nurse Practitioner

## 2023-12-29 ENCOUNTER — Encounter: Payer: Self-pay | Admitting: Nurse Practitioner

## 2023-12-29 VITALS — BP 135/75 | HR 66 | Temp 97.3°F | Ht 64.0 in | Wt 201.0 lb

## 2023-12-29 DIAGNOSIS — E890 Postprocedural hypothyroidism: Secondary | ICD-10-CM

## 2023-12-29 DIAGNOSIS — Z1211 Encounter for screening for malignant neoplasm of colon: Secondary | ICD-10-CM

## 2023-12-29 DIAGNOSIS — E782 Mixed hyperlipidemia: Secondary | ICD-10-CM | POA: Diagnosis not present

## 2023-12-29 DIAGNOSIS — F411 Generalized anxiety disorder: Secondary | ICD-10-CM | POA: Diagnosis not present

## 2023-12-29 DIAGNOSIS — M8588 Other specified disorders of bone density and structure, other site: Secondary | ICD-10-CM

## 2023-12-29 DIAGNOSIS — Z23 Encounter for immunization: Secondary | ICD-10-CM

## 2023-12-29 DIAGNOSIS — I10 Essential (primary) hypertension: Secondary | ICD-10-CM | POA: Diagnosis not present

## 2023-12-29 DIAGNOSIS — F3342 Major depressive disorder, recurrent, in full remission: Secondary | ICD-10-CM

## 2023-12-29 DIAGNOSIS — M722 Plantar fascial fibromatosis: Secondary | ICD-10-CM

## 2023-12-29 MED ORDER — PREDNISONE 10 MG (21) PO TBPK: ORAL_TABLET | ORAL | 0 refills | Status: DC

## 2023-12-29 MED ORDER — DULOXETINE HCL 30 MG PO CPEP: 30.0000 mg | ORAL_CAPSULE | Freq: Every day | ORAL | 1 refills | Status: AC

## 2023-12-29 MED ORDER — SIMVASTATIN 40 MG PO TABS: ORAL_TABLET | ORAL | 1 refills | Status: AC

## 2023-12-29 MED ORDER — LEVOTHYROXINE SODIUM 50 MCG PO TABS: 50.0000 ug | ORAL_TABLET | Freq: Every day | ORAL | 1 refills | Status: AC

## 2023-12-29 MED ORDER — LOSARTAN POTASSIUM 100 MG PO TABS: 100.0000 mg | ORAL_TABLET | Freq: Every day | ORAL | 1 refills | Status: AC

## 2023-12-29 NOTE — Addendum Note (Signed)
 Addended by: VIKTORIA ALAN MATSU on: 12/29/2023 02:24 PM   Modules accepted: Orders

## 2023-12-29 NOTE — Progress Notes (Signed)
 Subjective:    Patient ID: Linda Hernandez, female    DOB: November 18, 1950, 73 y.o.   MRN: [id number]   Chief Complaint: medical management of chronic issues     HPI:  Linda Hernandez is a 73 y.o. who identifies as a female who was assigned female at birth.   Social history: Lives with: husband Work history: retired   Water engineer in today for follow up of the following chronic medical issues:  1. Primary hypertension No c/o chest pain, sob or headache. Does not check blood pressure at home. BP Readings from Last 3 Encounters:  12/29/23 135/75  06/19/23 129/65  12/30/22 126/76     2. Mixed hyperlipidemia Does try to watch diet but does not do any exercise. Lab Results  Component Value Date   CHOL 140 06/19/2023   HDL 73 06/19/2023   LDLCALC 47 06/19/2023   TRIG 113 06/19/2023   CHOLHDL 1.9 06/19/2023    3. Postoperative hypothyroidism No issues that she is aware of. Lab Results  Component Value Date   TSH 0.598 06/19/2023     4. Recurrent major depressive disorder, in full remission (HCC) Has been on cymbalta  the last several years and is doing well.    12/29/2023   10:06 AM 06/19/2023   12:25 PM 06/19/2023   12:24 PM  Depression screen PHQ 2/9  Decreased Interest 0  0  Down, Depressed, Hopeless 0 0 0  PHQ - 2 Score 0 0 0  Altered sleeping 0 0   Tired, decreased energy 0 0   Change in appetite 0 0   Feeling bad or failure about yourself  0 0   Trouble concentrating 0 0   Moving slowly or fidgety/restless 0 0   Suicidal thoughts 0 0   PHQ-9 Score 0 0   Difficult doing work/chores Not difficult at all Not difficult at all       5. GAD (generalized anxiety disorder) Cymbalta  helps as well with her anxiety    12/29/2023   10:06 AM 12/30/2022    9:59 AM 06/25/2022    9:28 AM 12/24/2021   10:16 AM  GAD 7 : Generalized Anxiety Score  Nervous, Anxious, on Edge 0 0 0 0  Control/stop worrying 0 0 0 0  Worry too much - different things 0 0 0 0  Trouble relaxing 0  0 0 0  Restless 0 0 0 0  Easily annoyed or irritable 0 0 0 0  Afraid - awful might happen 0 0 0 0  Total GAD 7 Score 0 0 0 0  Anxiety Difficulty Not difficult at all Not difficult at all Not difficult at all Not difficult at all     6. Osteopenia of lumbar spine Last dexascan was done on 06/25/22. Her t score was  -1.3. she does no weig bearing  exercise.   7. Morbid obesity (HCC) Wt Readings from Last 3 Encounters:  12/29/23 201 lb (91.2 kg)  06/19/23 201 lb (91.2 kg)  01/27/23 199 lb (90.3 kg)   BMI Readings from Last 3 Encounters:  12/29/23 34.50 kg/m  06/19/23 34.50 kg/m  01/27/23 34.16 kg/m        New complaints: Left foot pain. Started in may. Pain when walking. Pain intensity increases and decreases. Pain in in arch of foot and radiates up unto the medial side of heel.  Rates pain 7-8/10 when walking.   Allergies  Allergen Reactions   Penicillins Rash   Outpatient Encounter Medications as of  12/29/2023  Medication Sig   Ascorbic Acid (VITAMIN C) 100 MG tablet Take 100 mg by mouth daily.   Calcium Carbonate (CALTRATE 600 PO) Take by mouth.   Cholecalciferol (VITAMIN D) 2000 units tablet Take 2,000 Units by mouth daily.    cyanocobalamin 1000 MCG tablet Take 1,000 mcg by mouth daily.   DULoxetine  (CYMBALTA ) 30 MG capsule Take 1 capsule (30 mg total) by mouth daily.   fish oil-omega-3 fatty acids 1000 MG capsule Take 2 g by mouth daily.   levothyroxine  (SYNTHROID ) 50 MCG tablet TAKE 1 TABLET BY MOUTH EVERY DAY   losartan  (COZAAR ) 100 MG tablet Take 1 tablet (100 mg total) by mouth daily.   simvastatin  (ZOCOR ) 40 MG tablet TAKE 1 TABLET BY MOUTH DAILY AT 6 PM.   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      Family History  Problem Relation Age of Onset   Diabetes Mother        pre diabetic  per patient   Diabetes Father    Heart disease Father    Hypertension Father    Diabetes Sister    Down syndrome Sister    Intellectual disability Sister    Hypertension Brother    Diabetes Brother    Obesity Brother    Diabetes Brother    Colon cancer Paternal Uncle        60's   Colon cancer Paternal Grandmother        58's   Breast cancer Cousin    Esophageal cancer Neg Hx    Rectal cancer Neg Hx    Stomach cancer Neg Hx       Controlled substance contract: n/a     Review of Systems  Constitutional:  Negative for diaphoresis.  Eyes:  Negative for pain.  Respiratory:  Negative for shortness of breath.   Cardiovascular:  Negative for chest pain, palpitations and leg swelling.  Gastrointestinal:  Negative for abdominal pain.  Endocrine: Negative for polydipsia.  Skin:  Negative for rash.  Neurological:  Negative for dizziness, weakness and headaches.  Hematological:  Does not bruise/bleed easily.  All other systems reviewed and are negative.      Objective:   Physical Exam Vitals and nursing note reviewed.  Constitutional:      General: She is not in acute distress.    Appearance: Normal appearance. She is well-developed.  HENT:     Head: Normocephalic.     Right Ear: Tympanic membrane normal.     Left Ear: Tympanic membrane normal.     Nose: Nose normal.     Mouth/Throat:     Mouth: Mucous membranes are moist.  Eyes:     Pupils: Pupils are equal, round, and reactive to light.  Neck:     Vascular: No carotid bruit or JVD.  Cardiovascular:     Rate and Rhythm: Normal rate and regular rhythm.     Heart sounds: Normal heart sounds.  Pulmonary:     Effort: Pulmonary  effort is normal. No respiratory distress.      Breath sounds: Normal breath sounds. No wheezing or rales.  Chest:     Chest wall: No tenderness.  Abdominal:     General: Bowel sounds are normal. There is no distension or abdominal bruit.     Palpations: Abdomen is soft. There is no hepatomegaly, splenomegaly, mass or pulsatile mass.     Tenderness: There is no abdominal tenderness.  Musculoskeletal:        General: Normal range of motion.     Cervical back: Normal range of motion and neck supple.     Comments: No pain in left foot on palpation or when moving  Lymphadenopathy:     Cervical: No cervical adenopathy.  Skin:    General: Skin is warm and dry.  Neurological:     Mental Status: She is alert and oriented to person, place, and time.     Deep Tendon Reflexes: Reflexes are normal and symmetric.  Psychiatric:        Behavior: Behavior normal.        Thought Content: Thought content normal.        Judgment: Judgment normal.    BP 135/75   Pulse 66   Temp (!) 97.3 F (36.3 C) (Temporal)   Ht 5' 4 (1.626 m)   Wt 201 lb (91.2 kg)   LMP 06/01/1997   SpO2 94%   BMI 34.50 kg/m          Assessment & Plan:   Linda Hernandez comes in today with chief complaint of Medical Management of Chronic Issues   Diagnosis and orders addressed:  1. Primary hypertension Low sodium diet - losartan  (COZAAR ) 100 MG tablet; Take 1 tablet (100 mg total) by mouth daily.  Dispense: 90 tablet; Refill: 1 - CBC with Differential/Platelet - CMP14+EGFR  2. Mixed hyperlipidemia Low fat diet - simvastatin  (ZOCOR ) 40 MG tablet; TAKE 1 TABLET BY MOUTH DAILY AT 6 PM.  Dispense: 90 tablet; Refill: 1 - Lipid panel  3. Postoperative hypothyroidism Labs pending - levothyroxine  (SYNTHROID ) 50 MCG tablet; Take 1 tablet (50 mcg total) by mouth daily.  Dispense: 90 tablet; Refill: 1 - Thyroid  Panel With TSH  4. Recurrent major depressive disorder, in full remission (HCC) Stress management - DULoxetine  (CYMBALTA ) 30 MG capsule; Take 1 capsule  (30 mg total) by mouth daily.  Dispense: 90 capsule; Refill: 1  5. GAD (generalized anxiety disorder)  6. Osteopenia of lumbar spine Weight bearing exercises encouraged  7. Morbid obesity (HCC) Discussed diet and exercise for person with BMI >25 Will recheck weight in 3-6 months  8. Plantar fascitis Ice Rest Steroids as directed   Labs pending Health Maintenance reviewed Diet and exercise encouraged  Follow up plan: 6 months   Linda Gladis, FNP

## 2023-12-29 NOTE — Patient Instructions (Signed)
 Plantar Fasciitis: What to Know  Your plantar fascia is a band of thick tissue on the bottom of your foot. It connects your heel bone to the base of your toes. If the fascia gets irritated, it can cause pain in your heel or foot. This is called plantar fasciitis. In some cases, plantar fasciitis can make it hard for you to walk or move. The pain is often worse in the morning after sleeping, or after sitting or lying down for a long time. Pain may also be worse after walking or standing for a long time. What are the causes? Plantar fasciitis may be caused by: Standing for a long time. Wearing shoes that don't have good arch support. Doing high-impact activities. These are things that put stress on your joints. They include: Ballet. Aerobic exercises. These are exercises that make your heart beat faster. Being overweight. Having a way of walking, or gait, that isn't normal. Tight muscles in your calf, which is in the back of your lower leg. High arches in your feet, or flat feet. Starting a new sport or activity. What are the signs or symptoms? The main symptom of plantar fasciitis is heel pain. Your pain may get worse after: You take your first steps after a time of rest. This includes in the morning after you wake up, or after you've been sitting or lying down for a while. Standing still for a long time. Pain may lessen after 30-45 minutes of activity, such as gentle walking. How is this diagnosed? Plantar fasciitis may be diagnosed based on your medical history, your symptoms, and an exam. Your health care provider will check for: A tender spot on the bottom of your foot. A high arch in your foot, or flat feet. Pain when you move your foot. Trouble moving your foot. You may also have tests. These may include: X-rays. Ultrasound. MRI. How is this treated? Treatment depends on how bad your plantar fasciitis is. It may include: RICE therapy. This stands for rest, ice, pressure  (compression), and raising (elevating) the foot. Exercises to stretch your calves and plantar fascia. A night splint. This holds your foot in a stretched, upward position while you sleep. Physical therapy. This can help with symptoms. It can also prevent problems in the future. Shots of a steroid medicine called cortisone. This can help with pain and irritation. Extracorporeal shock wave therapy. This uses electric shocks to stimulate your plantar fascia. If other treatments don't help, you may need to have surgery. Follow these instructions at home: Managing pain, stiffness, and swelling  Use ice, an ice pack, or a frozen bottle of water as told. Place a towel between your skin and the ice. Roll the bottom of your foot over the ice or frozen bottle. Do this for 20 minutes, 2-3 times a day. If your skin turns red, take off the ice right away to prevent skin damage. The risk of damage is higher if you can't feel pain, heat, or cold. Wear shoes that have air-sole or gel-sole cushions. You could also try soft shoe inserts made for plantar fasciitis. Activity Try not to do things that cause pain. Ask what things are safe for you to do. Exercise as told. Try activities that are low impact. This means that they're easier on your joints. They include: Swimming. Water aerobics. Biking. General instructions Take your medicines only as told. Wear a night splint as told. Loosen the splint if your toes tingle, are numb, or turn cold and blue.  Stay at a healthy weight. Work with your provider to lose weight as needed. Contact a health care provider if: Your symptoms don't go away with treatment. You have pain that gets worse. Your pain makes it hard to move or do everyday things. This information is not intended to replace advice given to you by your health care provider. Make sure you discuss any questions you have with your health care provider. Document Revised: 07/29/2022 Document Reviewed:  07/29/2022 Elsevier Patient Education  2024 [employer].

## 2023-12-30 ENCOUNTER — Ambulatory Visit: Payer: Self-pay | Admitting: Nurse Practitioner

## 2023-12-30 DIAGNOSIS — R739 Hyperglycemia, unspecified: Secondary | ICD-10-CM

## 2023-12-30 LAB — CMP14+EGFR
ALT: 14 IU/L (ref 0–32)
AST: 18 IU/L (ref 0–40)
Albumin: 4.3 g/dL (ref 3.8–4.8)
Alkaline Phosphatase: 92 IU/L (ref 49–135)
BUN/Creatinine Ratio: 14 (ref 12–28)
BUN: 15 mg/dL (ref 8–27)
Bilirubin Total: 0.8 mg/dL (ref 0.0–1.2)
CO2: 23 mmol/L (ref 20–29)
Calcium: 11 mg/dL — ABNORMAL HIGH (ref 8.7–10.3)
Chloride: 103 mmol/L (ref 96–106)
Creatinine, Ser: 1.05 mg/dL — ABNORMAL HIGH (ref 0.57–1.00)
Globulin, Total: 2.3 g/dL (ref 1.5–4.5)
Glucose: 130 mg/dL — ABNORMAL HIGH (ref 70–99)
Potassium: 4.7 mmol/L (ref 3.5–5.2)
Sodium: 139 mmol/L (ref 134–144)
Total Protein: 6.6 g/dL (ref 6.0–8.5)
eGFR: 57 mL/min/1.73 — ABNORMAL LOW (ref >59)

## 2023-12-30 LAB — CBC WITH DIFFERENTIAL/PLATELET
Basophils Absolute: 0.1 x10E3/uL (ref 0.0–0.2)
Basos: 1 %
EOS (ABSOLUTE): 0.1 x10E3/uL (ref 0.0–0.4)
Eos: 2 %
Hematocrit: 48.2 % — ABNORMAL HIGH (ref 34.0–46.6)
Hemoglobin: 15.9 g/dL (ref 11.1–15.9)
Immature Grans (Abs): 0 x10E3/uL (ref 0.0–0.1)
Immature Granulocytes: 0 %
Lymphocytes Absolute: 1.6 x10E3/uL (ref 0.7–3.1)
Lymphs: 33 %
MCH: 30.6 pg (ref 26.6–33.0)
MCHC: 33 g/dL (ref 31.5–35.7)
MCV: 93 fL (ref 79–97)
Monocytes Absolute: 0.4 x10E3/uL (ref 0.1–0.9)
Monocytes: 8 %
Neutrophils Absolute: 2.7 x10E3/uL (ref 1.4–7.0)
Neutrophils: 56 %
Platelets: 211 x10E3/uL (ref 150–450)
RBC: 5.2 x10E6/uL (ref 3.77–5.28)
RDW: 12.7 % (ref 11.7–15.4)
WBC: 4.8 x10E3/uL (ref 3.4–10.8)

## 2023-12-30 LAB — THYROID PANEL WITH TSH
Free Thyroxine Index: 3.3 (ref 1.2–4.9)
T3 Uptake Ratio: 29 % (ref 24–39)
T4, Total: 11.5 ug/dL (ref 4.5–12.0)
TSH: 0.995 u[IU]/mL (ref 0.450–4.500)

## 2023-12-30 LAB — LIPID PANEL
Chol/HDL Ratio: 2.2 ratio (ref 0.0–4.4)
Cholesterol, Total: 153 mg/dL (ref 100–199)
HDL: 71 mg/dL (ref >39)
LDL Chol Calc (NIH): 61 mg/dL (ref 0–99)
Triglycerides: 120 mg/dL (ref 0–149)
VLDL Cholesterol Cal: 21 mg/dL (ref 5–40)

## 2023-12-31 LAB — HGB A1C W/O EAG: Hgb A1c MFr Bld: 6.6 % — ABNORMAL HIGH (ref 4.8–5.6)

## 2023-12-31 LAB — SPECIMEN STATUS REPORT

## 2024-01-22 ENCOUNTER — Other Ambulatory Visit: Payer: Self-pay

## 2024-01-22 DIAGNOSIS — M722 Plantar fascial fibromatosis: Secondary | ICD-10-CM

## 2024-01-22 NOTE — Telephone Encounter (Signed)
 Needs to see either podiatry or ortho

## 2024-01-28 ENCOUNTER — Ambulatory Visit: Payer: Medicare Other

## 2024-01-28 VITALS — BP 135/75 | HR 66 | Ht 60.0 in | Wt 201.0 lb

## 2024-01-28 DIAGNOSIS — Z0000 Encounter for general adult medical examination without abnormal findings: Secondary | ICD-10-CM | POA: Diagnosis not present

## 2024-01-28 DIAGNOSIS — Z1211 Encounter for screening for malignant neoplasm of colon: Secondary | ICD-10-CM

## 2024-01-28 NOTE — Progress Notes (Cosign Needed Addendum)
 Chief Complaint  Patient presents with   Medicare Wellness     Subjective:   Linda Hernandez is a 73 y.o. female who presents for a Medicare Annual Wellness Visit.  Allergies (verified) Penicillins   History: Past Medical History:  Diagnosis Date   Allergy Childhood   Penicillin   Anxiety    Cataract January 2023   In process of cataract removal   Hx of adenomatous colonic polyps 06/19/2016   Hyperlipidemia    Hypertension    Snoring    Thyroid  disease    thyroid  tumor   Past Surgical History:  Procedure Laterality Date   ABDOMINAL HYSTERECTOMY     CATARACT EXTRACTION Left    COLONOSCOPY     DENTAL RESTORATION/EXTRACTION WITH X-RAY     DILATION AND CURETTAGE OF UTERUS     EYE SURGERY  December 21, 2021   Cataract removal   MOHS SURGERY     TUMOR EXCISION     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     Family History  Problem Relation Age of Onset   Diabetes Mother        pre diabetic  per patient   Diabetes Father    Heart disease Father    Hypertension Father    Diabetes Sister    Down syndrome Sister    Intellectual disability Sister    Hypertension Brother    Diabetes Brother    Obesity Brother    Diabetes Brother    Colon cancer Paternal Uncle        60's   Colon cancer Paternal Grandmother        25's   Breast cancer Cousin    Esophageal cancer Neg Hx    Rectal cancer Neg Hx    Stomach cancer Neg Hx    Social History   Occupational History   Occupation: retired  Tobacco Use   Smoking status: Never   Smokeless tobacco: Never  Vaping Use   Vaping status: Never Used  Substance and Sexual Activity   Alcohol use: Not Currently    Comment: extremely rare 2 times yearly   Drug use: Never   Sexual activity: Yes    Partners: Male    Birth control/protection: Surgical, Other-see comments   Tobacco Counseling Counseling given: Yes  SDOH Screenings   Food Insecurity: No Food Insecurity (01/28/2024)  Housing: Low Risk  (01/28/2024)   Transportation Needs: No Transportation Needs (01/28/2024)  Utilities: Not At Risk (01/28/2024)  Alcohol Screen: Low Risk  (12/28/2023)  Depression (PHQ2-9): Low Risk  (01/28/2024)  Financial Resource Strain: Low Risk  (12/28/2023)  Physical Activity: Insufficiently Active (01/28/2024)  Social Connections: Socially Integrated (01/28/2024)  Stress: No Stress Concern Present (01/28/2024)  Tobacco Use: Low Risk  (01/28/2024)  Health Literacy: Adequate Health Literacy (01/28/2024)   See flowsheets for full screening details  Depression Screen PHQ 2 & 9 Depression Scale- Over the past 2 weeks, how often have you been bothered by any of the following problems? Little interest or pleasure in doing things: 0 Feeling down, depressed, or hopeless (PHQ Adolescent also includes...irritable): 0 PHQ-2 Total Score: 0 Trouble falling or staying asleep, or sleeping too much: 0 Feeling tired or having little energy: 0 Poor appetite or overeating (PHQ Adolescent also includes...weight loss): 0 Feeling bad about yourself - or that you are a failure or have let yourself or your family down: 0 Trouble concentrating on things, such as reading the newspaper or watching television (PHQ Adolescent also  includes...like school work): 0 Moving or speaking so slowly that other people could have noticed. Or the opposite - being so fidgety or restless that you have been moving around a lot more than usual: 0 Thoughts that you would be better off dead, or of hurting yourself in some way: 0 PHQ-9 Total Score: 0 If you checked off any problems, how difficult have these problems made it for you to do your work, take care of things at home, or get along with other people?: Not difficult at all     Goals Addressed             This Visit's Progress    Patient Stated   On track    12/25/2021 AWV Goal: Exercise for General Health  Patient will verbalize understanding of the benefits of increased physical  activity: Exercising regularly is important. It will improve your overall fitness, flexibility, and endurance. Regular exercise also will improve your overall health. It can help you control your weight, reduce stress, and improve your bone density. Over the next year, patient will increase physical activity as tolerated with a goal of at least 150 minutes of moderate physical activity per week.  You can tell that you are exercising at a moderate intensity if your heart starts beating faster and you start breathing faster but can still hold a conversation. Moderate-intensity exercise ideas include: Walking 1 mile (1.6 km) in about 15 minutes Biking Hiking Golfing Dancing Water aerobics Patient will verbalize understanding of everyday activities that increase physical activity by providing examples like the following: Yard work, such as: Insurance Underwriter Gardening Washing windows or floors Patient will be able to explain general safety guidelines for exercising:  Before you start a new exercise program, talk with your health care provider. Do not exercise so much that you hurt yourself, feel dizzy, or get very short of breath. Wear comfortable clothes and wear shoes with good support. Drink plenty of water while you exercise to prevent dehydration or heat stroke. Work out until your breathing and your heartbeat get faster.        Visit info / Clinical Intake: Medicare Wellness Visit Type:: Subsequent Annual Wellness Visit Persons participating in visit:: patient Medicare Wellness Visit Mode:: Telephone If telephone:: video declined Because this visit was a virtual/telehealth visit:: vitals recorded from last visit If Telephone or Video please confirm:: I connected with the patient using audio enabled telemedicine application and verified that I am speaking with the correct person using two  identifiers Patient Location:: home Provider Location:: home office Information given by:: patient Interpreter Needed?: No Pre-visit prep was completed: yes AWV questionnaire completed by patient prior to visit?: no Living arrangements:: lives with spouse/significant other Patient's Overall Health Status Rating: very good Typical amount of pain: none Does pain affect daily life?: no Are you currently prescribed opioids?: no  Dietary Habits and Nutritional Risks How many meals a day?: 3 Eats fruit and vegetables daily?: yes Most meals are obtained by: preparing own meals In the last 2 weeks, have you had any of the following?: none Diabetic:: no  Functional Status Activities of Daily Living (to include ambulation/medication): Independent Ambulation: Independent Medication Administration: Independent Home Management: Independent Manage your own finances?: yes Primary transportation is: driving Concerns about hearing?: no  Fall Screening Falls in the past year?: 0 Number of falls in past year: 0 Was there an injury with Fall?: (Patient-Rptd) 0 Fall  Risk Category Calculator: (Patient-Rptd) 0 Patient Fall Risk Level: (Patient-Rptd) Low Fall Risk  Fall Risk Patient at Risk for Falls Due to: No Fall Risks Fall risk Follow up: Falls evaluation completed; Education provided  Home and Transportation Safety: All rugs have non-skid backing?: yes All stairs or steps have railings?: (!) no Grab bars in the bathtub or shower?: (!) no Have non-skid surface in bathtub or shower?: yes Good home lighting?: yes Regular seat belt use?: yes Hospital stays in the last year:: no  Cognitive Assessment Difficulty concentrating, remembering, or making decisions? : no Will 6CIT or Mini Cog be Completed: yes What year is it?: 0 points What month is it?: 0 points Give patient an address phrase to remember (5 components): [address] [place] what time is it?: 0 points Count  backwards from 20 to 1: 0 points Say the months of the year in reverse: 0 points Repeat the address phrase from earlier: 0 points 6 CIT Score: 0 points  Advance Directives (For Healthcare) Does Patient Have a Medical Advance Directive?: No Would patient like information on creating a medical advance directive?: No - Patient declined  Reviewed/Updated  Reviewed/Updated: Reviewed All (Medical, Surgical, Family, Medications, Allergies, Care Teams, Patient Goals); Medical History; Surgical History; Family History; Medications; Allergies; Care Teams; Patient Goals        Objective:    Today's Vitals   01/28/24 1059  BP: 135/75  Pulse: 66  Weight: 201 lb (91.2 kg)  Height: 5' (1.524 m)   Body mass index is 39.26 kg/m.  Current Medications (verified) Outpatient Encounter Medications as of 01/28/2024  Medication Sig   Ascorbic Acid (VITAMIN C) 100 MG tablet Take 100 mg by mouth daily.   Calcium Carbonate (CALTRATE 600 PO) Take by mouth.   Cholecalciferol (VITAMIN D) 2000 units tablet Take 2,000 Units by mouth daily.    cyanocobalamin 1000 MCG tablet Take 1,000 mcg by mouth daily.   DULoxetine  (CYMBALTA ) 30 MG capsule Take 1 capsule (30 mg total) by mouth daily.   fish oil-omega-3 fatty acids 1000 MG capsule Take 2 g by mouth daily.   levothyroxine  (SYNTHROID ) 50 MCG tablet Take 1 tablet (50 mcg total) by mouth daily.   losartan  (COZAAR ) 100 MG tablet Take 1 tablet (100 mg total) by mouth daily.   predniSONE  (STERAPRED UNI-PAK 21 TAB) 10 MG (21) TBPK tablet As directed x 6 days   simvastatin  (ZOCOR ) 40 MG tablet TAKE 1 TABLET BY MOUTH DAILY AT 6 PM.   Facility-Administered Encounter Medications as of 01/28/2024  Medication   0.9 %  sodium chloride  infusion   Hearing/Vision screen No results found. Immunizations and Health Maintenance Health Maintenance  Topic Date Due   Colonoscopy  06/20/2023   COVID-19 Vaccine (5 - 2025-26 season) 11/10/2023   DEXA SCAN  06/24/2024    Medicare Annual Wellness (AWV)  01/27/2025   Mammogram  05/06/2025   DTaP/Tdap/Td (3 - Td or Tdap) 12/28/2033   Pneumococcal Vaccine: 50+ Years  Completed   Influenza Vaccine  Completed   Hepatitis C Screening  Completed   Zoster Vaccines- Shingrix   Completed   Meningococcal B Vaccine  Aged Out        Assessment/Plan:  This is a routine wellness examination for Linda Hernandez.  Patient Care Team: Gladis Mustard, FNP as PCP - General (Nurse Practitioner) [place], [place], [place] as Referring Physician (Optometry)  I have personally reviewed and noted the following in the patient's chart:   Medical and social history Use  of alcohol, tobacco or illicit drugs  Current medications and supplements including opioid prescriptions. Functional ability and status Nutritional status Physical activity Advanced directives List of other physicians Hospitalizations, surgeries, and ER visits in previous 12 months Vitals Screenings to include cognitive, depression, and falls Referrals and appointments  Orders Placed This Encounter  Procedures   Ambulatory referral to Gastroenterology    Referral Priority:   Routine    Referral Type:   Consultation    Referral Reason:   Specialty Services Required    Number of Visits Requested:   1   In addition, I have reviewed and discussed with patient certain preventive protocols, quality metrics, and best practice recommendations. A written personalized care plan for preventive services as well as general preventive health recommendations were provided to patient.   Ozie Ned, CMA   01/28/2024   Return in 1 year (on 01/27/2025).  After Visit Summary: (MyChart) Due to this being a telephonic visit, the after visit summary with patients personalized plan was offered to patient via MyChart   Nurse Notes: pt aware ref GI for colonoscopy ordered  I have reviewed and agree with the above AWV documentation.   Mary-Margaret Gladis, FNP

## 2024-02-11 NOTE — Telephone Encounter (Signed)
 Can someone please check status of Podiatry referral that was placed on 11/13?

## 2024-02-17 ENCOUNTER — Ambulatory Visit: Admitting: Podiatry

## 2024-02-17 ENCOUNTER — Ambulatory Visit

## 2024-02-17 ENCOUNTER — Encounter: Payer: Self-pay | Admitting: Podiatry

## 2024-02-17 DIAGNOSIS — M722 Plantar fascial fibromatosis: Secondary | ICD-10-CM | POA: Diagnosis not present

## 2024-02-17 DIAGNOSIS — M19072 Primary osteoarthritis, left ankle and foot: Secondary | ICD-10-CM

## 2024-02-17 MED ORDER — TRIAMCINOLONE ACETONIDE 40 MG/ML IJ SUSP
20.0000 mg | Freq: Once | INTRAMUSCULAR | Status: AC
  Administered 2024-02-17: 20 mg

## 2024-02-17 NOTE — Progress Notes (Signed)
 Subjective:  Patient ID: Linda Hernandez, female    DOB: 23-Oct-1950,  MRN: [id number] HPI Chief Complaint  Patient presents with   Foot Pain    Arch left - feel like walking on a lump since May 2025, radiating medial foot and ankle, tried ice, pred pack from PCP, change in shoes-no help, only feels pain when walking, no injury   New Patient (Initial Visit)    73 y.o. female presents with the above complaint.   ROS: Denies fever chills nausea vomiting muscle aches pains calf pain back pain chest pain shortness of breath.  Past Medical History:  Diagnosis Date   Allergy Childhood   Penicillin   Anxiety    Cataract January 2023   In process of cataract removal   Hx of adenomatous colonic polyps 06/19/2016   Hyperlipidemia    Hypertension    Snoring    Thyroid  disease    thyroid  tumor   Past Surgical History:  Procedure Laterality Date   ABDOMINAL HYSTERECTOMY     CATARACT EXTRACTION Left    COLONOSCOPY     DENTAL RESTORATION/EXTRACTION WITH X-RAY     DILATION AND CURETTAGE OF UTERUS     EYE SURGERY  December 21, 2021   Cataract removal   MOHS SURGERY     TUMOR EXCISION     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      Current Outpatient Medications:    Ascorbic Acid (VITAMIN C) 100 MG tablet, Take 100 mg by mouth daily., Disp: , Rfl:    Calcium Carbonate (CALTRATE 600 PO), Take by mouth., Disp: , Rfl:    Cholecalciferol (VITAMIN D) 2000 units tablet, Take 2,000 Units by mouth daily. , Disp: , Rfl:    cyanocobalamin 1000 MCG tablet, Take 1,000 mcg by mouth daily., Disp: , Rfl:    DULoxetine  (CYMBALTA ) 30 MG capsule, Take 1 capsule (30 mg total) by mouth daily., Disp: 90 capsule, Rfl: 1   fish oil-omega-3 fatty acids 1000 MG capsule, Take 2 g by mouth daily., Disp: , Rfl:    levothyroxine  (SYNTHROID ) 50 MCG tablet, Take 1 tablet (50 mcg total) by mouth daily., Disp: 90 tablet, Rfl: 1   losartan  (COZAAR ) 100 MG tablet, Take 1 tablet (100 mg total) by mouth daily., Disp: 90  tablet, Rfl: 1   simvastatin  (ZOCOR ) 40 MG tablet, TAKE 1 TABLET BY MOUTH DAILY AT 6 PM., Disp: 90 tablet, Rfl: 1  Current Facility-Administered Medications:    0.9 %  sodium chloride  infusion, 500 mL, Intravenous, Continuous, Avram Lupita BRAVO, MD  Allergies  Allergen Reactions   Penicillins Rash   Review of Systems Objective:  There were no vitals filed for this visit.  General: Well developed, nourished, in no acute distress, alert and oriented x3   Dermatological: Skin is warm, dry and supple bilateral. Nails x 10 are well maintained; remaining integument appears unremarkable at this time. There are no open sores, no preulcerative lesions, no rash or signs of infection present.  Vascular: Dorsalis Pedis artery and Posterior Tibial artery pedal pulses are 2/4 bilateral with immedate capillary fill time. Pedal hair growth present. No varicosities and no lower extremity edema present bilateral.   Neruologic: Grossly intact via light touch bilateral. Vibratory intact via tuning fork bilateral. Protective threshold with Semmes Wienstein monofilament intact to all pedal sites bilateral. Patellar and Achilles deep tendon reflexes 2+ bilateral. No Babinski or clonus noted bilateral.   Musculoskeletal: No gross boney pedal deformities bilateral. No pain, crepitus, or  limitation noted with foot and ankle range of motion bilateral. Muscular strength 5/5 in all groups tested bilateral.  Pes planovalgus of the left foot with some hypertrophic arthropathy is what it feels like particular on the bottom of the foot.  It feels more osseous than it does firm and fibrous I am not ruling out 100% fibroma plantarly.  Gait: Unassisted, Nonantalgic.    Radiographs:  Radiographs taken today demonstrate osseously mature individual with osteoarthritic change of the tarsometatarsal joints.  Pes planovalgus is noted with demineralization of the bone.  Assessment & Plan:   Assessment: Osteoarthritis left foot  pes planovalgus.  Cannot rule out plantar fibroma.    Plan: I injected the region of pain on the plantar medial aspect of the foot.  This is near her medial plantar nerve so I did express to her that this could cause her to have some prolonged numbness for 3 to 4 hours.  We did discuss appropriate shoe gear and I would like to follow-up with her in a few weeks if she is not improved.  May need to consider further imaging.     Linda Hernandez, [place]

## 2024-02-17 NOTE — Patient Instructions (Signed)

## 2024-03-23 ENCOUNTER — Other Ambulatory Visit: Payer: Self-pay | Admitting: Nurse Practitioner

## 2024-03-23 DIAGNOSIS — Z1231 Encounter for screening mammogram for malignant neoplasm of breast: Secondary | ICD-10-CM

## 2024-06-02 ENCOUNTER — Ambulatory Visit

## 2024-06-25 ENCOUNTER — Ambulatory Visit: Payer: Self-pay | Admitting: Nurse Practitioner
